# Patient Record
Sex: Female | Born: 1980
Health system: Southern US, Community
[De-identification: ages and names within clinical notes are randomized; demographics above are authoritative.]

## PROBLEM LIST (undated history)

## (undated) DIAGNOSIS — K219 Gastro-esophageal reflux disease without esophagitis: Secondary | ICD-10-CM

## (undated) DIAGNOSIS — I499 Cardiac arrhythmia, unspecified: Secondary | ICD-10-CM

## (undated) DIAGNOSIS — K449 Diaphragmatic hernia without obstruction or gangrene: Secondary | ICD-10-CM

## (undated) DIAGNOSIS — Z01419 Encounter for gynecological examination (general) (routine) without abnormal findings: Secondary | ICD-10-CM

## (undated) DIAGNOSIS — B999 Unspecified infectious disease: Secondary | ICD-10-CM

## (undated) HISTORY — PX: WISDOM TOOTH EXTRACTION: SHX21

## (undated) HISTORY — DX: Encounter for gynecological examination (general) (routine) without abnormal findings: Z01.419

---
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
## 1999-03-24 ENCOUNTER — Encounter (HOSPITAL_COMMUNITY): Admission: RE | Admit: 1999-03-24 | Discharge: 1999-03-30 | Payer: Self-pay | Admitting: *Deleted

## 1999-03-29 ENCOUNTER — Inpatient Hospital Stay (HOSPITAL_COMMUNITY): Admission: AD | Admit: 1999-03-29 | Discharge: 1999-03-31 | Payer: Self-pay | Admitting: *Deleted

## 1999-05-02 ENCOUNTER — Inpatient Hospital Stay: Admission: AD | Admit: 1999-05-02 | Discharge: 1999-05-02 | Payer: Self-pay | Admitting: *Deleted

## 1999-06-02 ENCOUNTER — Inpatient Hospital Stay (HOSPITAL_COMMUNITY): Admission: AD | Admit: 1999-06-02 | Discharge: 1999-06-02 | Payer: Self-pay | Admitting: *Deleted

## 1999-08-17 ENCOUNTER — Inpatient Hospital Stay (HOSPITAL_COMMUNITY): Admission: AD | Admit: 1999-08-17 | Discharge: 1999-08-17 | Payer: Self-pay | Admitting: *Deleted

## 1999-10-09 ENCOUNTER — Emergency Department (HOSPITAL_COMMUNITY): Admission: EM | Admit: 1999-10-09 | Discharge: 1999-10-09 | Payer: Self-pay | Admitting: Emergency Medicine

## 1999-11-23 ENCOUNTER — Inpatient Hospital Stay (HOSPITAL_COMMUNITY): Admission: AD | Admit: 1999-11-23 | Discharge: 1999-11-23 | Payer: Self-pay | Admitting: *Deleted

## 2000-02-14 ENCOUNTER — Inpatient Hospital Stay (HOSPITAL_COMMUNITY): Admission: AD | Admit: 2000-02-14 | Discharge: 2000-02-14 | Payer: Self-pay | Admitting: *Deleted

## 2000-05-08 ENCOUNTER — Inpatient Hospital Stay (HOSPITAL_COMMUNITY): Admission: AD | Admit: 2000-05-08 | Discharge: 2000-05-08 | Payer: Self-pay | Admitting: *Deleted

## 2000-05-11 ENCOUNTER — Emergency Department (HOSPITAL_COMMUNITY): Admission: EM | Admit: 2000-05-11 | Discharge: 2000-05-11 | Payer: Self-pay | Admitting: Emergency Medicine

## 2000-06-06 ENCOUNTER — Emergency Department (HOSPITAL_COMMUNITY): Admission: EM | Admit: 2000-06-06 | Discharge: 2000-06-07 | Payer: Self-pay

## 2000-07-31 ENCOUNTER — Inpatient Hospital Stay (HOSPITAL_COMMUNITY): Admission: AD | Admit: 2000-07-31 | Discharge: 2000-07-31 | Payer: Self-pay | Admitting: *Deleted

## 2000-10-23 ENCOUNTER — Inpatient Hospital Stay (HOSPITAL_COMMUNITY): Admission: AD | Admit: 2000-10-23 | Discharge: 2000-10-23 | Payer: Self-pay | Admitting: *Deleted

## 2001-10-14 ENCOUNTER — Encounter: Admission: RE | Admit: 2001-10-14 | Discharge: 2001-10-14 | Payer: Self-pay | Admitting: *Deleted

## 2002-03-01 ENCOUNTER — Inpatient Hospital Stay (HOSPITAL_COMMUNITY): Admission: AD | Admit: 2002-03-01 | Discharge: 2002-03-01 | Payer: Self-pay | Admitting: Obstetrics and Gynecology

## 2002-03-27 ENCOUNTER — Encounter: Payer: Self-pay | Admitting: Obstetrics

## 2002-03-27 ENCOUNTER — Encounter: Admission: RE | Admit: 2002-03-27 | Discharge: 2002-03-27 | Payer: Self-pay | Admitting: Obstetrics

## 2002-09-03 ENCOUNTER — Inpatient Hospital Stay (HOSPITAL_COMMUNITY): Admission: AD | Admit: 2002-09-03 | Discharge: 2002-09-03 | Payer: Self-pay | Admitting: Obstetrics

## 2002-09-05 ENCOUNTER — Inpatient Hospital Stay (HOSPITAL_COMMUNITY): Admission: AD | Admit: 2002-09-05 | Discharge: 2002-09-07 | Payer: Self-pay | Admitting: Obstetrics

## 2002-09-30 ENCOUNTER — Emergency Department (HOSPITAL_COMMUNITY): Admission: EM | Admit: 2002-09-30 | Discharge: 2002-09-30 | Payer: Self-pay | Admitting: Emergency Medicine

## 2003-08-15 ENCOUNTER — Emergency Department (HOSPITAL_COMMUNITY): Admission: EM | Admit: 2003-08-15 | Discharge: 2003-08-15 | Payer: Self-pay | Admitting: Emergency Medicine

## 2004-04-29 ENCOUNTER — Ambulatory Visit: Payer: Self-pay | Admitting: Internal Medicine

## 2004-05-09 ENCOUNTER — Ambulatory Visit: Payer: Self-pay | Admitting: Family Medicine

## 2004-08-03 ENCOUNTER — Ambulatory Visit: Payer: Self-pay | Admitting: Family Medicine

## 2004-10-18 ENCOUNTER — Ambulatory Visit: Payer: Self-pay | Admitting: Family Medicine

## 2005-01-12 ENCOUNTER — Ambulatory Visit: Payer: Self-pay | Admitting: Internal Medicine

## 2005-05-02 ENCOUNTER — Ambulatory Visit: Payer: Self-pay | Admitting: Family Medicine

## 2005-07-26 ENCOUNTER — Encounter: Payer: Self-pay | Admitting: Family Medicine

## 2005-07-26 ENCOUNTER — Ambulatory Visit: Payer: Self-pay | Admitting: Family Medicine

## 2005-07-26 ENCOUNTER — Other Ambulatory Visit: Admission: RE | Admit: 2005-07-26 | Discharge: 2005-07-26 | Payer: Self-pay | Admitting: Family Medicine

## 2005-10-12 ENCOUNTER — Ambulatory Visit: Payer: Self-pay | Admitting: Family Medicine

## 2006-01-02 ENCOUNTER — Ambulatory Visit: Payer: Self-pay | Admitting: Family Medicine

## 2006-03-28 ENCOUNTER — Ambulatory Visit: Payer: Self-pay | Admitting: Family Medicine

## 2006-06-21 ENCOUNTER — Ambulatory Visit: Payer: Self-pay | Admitting: Family Medicine

## 2006-09-13 ENCOUNTER — Ambulatory Visit: Payer: Self-pay | Admitting: Family Medicine

## 2006-12-16 ENCOUNTER — Ambulatory Visit: Payer: Self-pay | Admitting: Family Medicine

## 2007-03-10 ENCOUNTER — Ambulatory Visit: Payer: Self-pay | Admitting: Family Medicine

## 2007-10-10 ENCOUNTER — Ambulatory Visit: Payer: Self-pay | Admitting: Family Medicine

## 2007-10-10 LAB — CONVERTED CEMR LAB
ALT: 17 units/L (ref 0–35)
AST: 18 units/L (ref 0–37)
Albumin: 4.5 g/dL (ref 3.5–5.2)
Alkaline Phosphatase: 58 units/L (ref 39–117)
BUN: 12 mg/dL (ref 6–23)
Basophils Absolute: 0 10*3/uL (ref 0.0–0.1)
Basophils Relative: 0.2 % (ref 0.0–1.0)
Bilirubin, Direct: 0.1 mg/dL (ref 0.0–0.3)
CO2: 27 meq/L (ref 19–32)
Calcium: 9.9 mg/dL (ref 8.4–10.5)
Chloride: 109 meq/L (ref 96–112)
Cholesterol: 190 mg/dL (ref 0–200)
Creatinine, Ser: 0.7 mg/dL (ref 0.4–1.2)
Eosinophils Absolute: 0 10*3/uL (ref 0.0–0.7)
Eosinophils Relative: 0.7 % (ref 0.0–5.0)
GFR calc Af Amer: 128 mL/min
GFR calc non Af Amer: 106 mL/min
Glucose, Bld: 91 mg/dL (ref 70–99)
HCT: 40.8 % (ref 36.0–46.0)
HDL: 43.5 mg/dL (ref >39.0)
Hemoglobin: 13.8 g/dL (ref 12.0–15.0)
LDL Cholesterol: 135 mg/dL — ABNORMAL HIGH (ref 0–99)
Lymphocytes Relative: 38.1 % (ref 12.0–46.0)
MCHC: 33.9 g/dL (ref 30.0–36.0)
MCV: 91 fL (ref 78.0–100.0)
Monocytes Absolute: 0.3 10*3/uL (ref 0.1–1.0)
Monocytes Relative: 6.1 % (ref 3.0–12.0)
Neutro Abs: 3 10*3/uL (ref 1.4–7.7)
Neutrophils Relative %: 54.9 % (ref 43.0–77.0)
Platelets: 200 10*3/uL (ref 150–400)
Potassium: 4.1 meq/L (ref 3.5–5.1)
RBC: 4.48 M/uL (ref 3.87–5.11)
RDW: 12 % (ref 11.5–14.6)
Sodium: 141 meq/L (ref 135–145)
TSH: 0.95 microintl units/mL (ref 0.35–5.50)
Total Bilirubin: 0.8 mg/dL (ref 0.3–1.2)
Total CHOL/HDL Ratio: 4.4
Total Protein: 7.7 g/dL (ref 6.0–8.3)
Triglycerides: 57 mg/dL (ref 0–149)
VLDL: 11 mg/dL (ref 0–40)
WBC: 5.3 10*3/uL (ref 4.5–10.5)

## 2007-10-13 ENCOUNTER — Encounter: Payer: Self-pay | Admitting: Family Medicine

## 2007-10-16 LAB — CONVERTED CEMR LAB
Bilirubin Urine: NEGATIVE
Glucose, Urine, Semiquant: NEGATIVE
Ketones, urine, test strip: NEGATIVE
Nitrite: NEGATIVE
Protein, U semiquant: NEGATIVE
Specific Gravity, Urine: 1.02
Urobilinogen, UA: 1
pH: 6.5

## 2008-03-12 ENCOUNTER — Ambulatory Visit: Payer: Self-pay | Admitting: Family Medicine

## 2008-03-12 DIAGNOSIS — N62 Hypertrophy of breast: Secondary | ICD-10-CM | POA: Insufficient documentation

## 2008-03-12 LAB — CONVERTED CEMR LAB: Beta hcg, urine, semiquantitative: NEGATIVE

## 2008-07-12 ENCOUNTER — Ambulatory Visit: Payer: Self-pay | Admitting: Family Medicine

## 2008-07-12 DIAGNOSIS — N3000 Acute cystitis without hematuria: Secondary | ICD-10-CM

## 2008-07-12 LAB — CONVERTED CEMR LAB
Beta hcg, urine, semiquantitative: NEGATIVE
Bilirubin Urine: NEGATIVE
Ketones, urine, test strip: NEGATIVE
Specific Gravity, Urine: 1.025
pH: 7

## 2008-07-22 ENCOUNTER — Telehealth: Payer: Self-pay | Admitting: Family Medicine

## 2008-07-22 ENCOUNTER — Ambulatory Visit: Payer: Self-pay | Admitting: Internal Medicine

## 2008-07-22 DIAGNOSIS — R599 Enlarged lymph nodes, unspecified: Secondary | ICD-10-CM | POA: Insufficient documentation

## 2008-07-28 ENCOUNTER — Telehealth: Payer: Self-pay | Admitting: Family Medicine

## 2008-08-06 ENCOUNTER — Telehealth: Payer: Self-pay | Admitting: Family Medicine

## 2008-09-24 ENCOUNTER — Ambulatory Visit: Payer: Self-pay | Admitting: Family Medicine

## 2008-09-24 LAB — CONVERTED CEMR LAB
Glucose, Urine, Semiquant: NEGATIVE
Specific Gravity, Urine: 1.02
WBC Urine, dipstick: NEGATIVE
pH: 5.5

## 2008-09-27 ENCOUNTER — Encounter: Payer: Self-pay | Admitting: Family Medicine

## 2008-09-27 LAB — CONVERTED CEMR LAB
ALT: 18 units/L (ref 0–35)
BUN: 10 mg/dL (ref 6–23)
Basophils Relative: 0.3 % (ref 0.0–3.0)
CO2: 24 meq/L (ref 19–32)
Chloride: 109 meq/L (ref 96–112)
Direct LDL: 123.7 mg/dL
Eosinophils Relative: 1.1 % (ref 0.0–5.0)
Glucose, Bld: 68 mg/dL — ABNORMAL LOW (ref 70–99)
HCT: 38.4 % (ref 36.0–46.0)
Lymphs Abs: 1.6 10*3/uL (ref 0.7–4.0)
MCV: 90.5 fL (ref 78.0–100.0)
Monocytes Absolute: 0.3 10*3/uL (ref 0.1–1.0)
Platelets: 167 10*3/uL (ref 150.0–400.0)
Potassium: 3.6 meq/L (ref 3.5–5.1)
TSH: 1.23 microintl units/mL (ref 0.35–5.50)
Total Bilirubin: 0.6 mg/dL (ref 0.3–1.2)
Total Protein: 7.3 g/dL (ref 6.0–8.3)
VLDL: 18.6 mg/dL (ref 0.0–40.0)
WBC: 3.9 10*3/uL — ABNORMAL LOW (ref 4.5–10.5)

## 2008-10-19 ENCOUNTER — Ambulatory Visit: Payer: Self-pay | Admitting: Family Medicine

## 2008-10-19 ENCOUNTER — Other Ambulatory Visit: Admission: RE | Admit: 2008-10-19 | Discharge: 2008-10-19 | Payer: Self-pay | Admitting: Family Medicine

## 2008-10-19 ENCOUNTER — Encounter: Payer: Self-pay | Admitting: Family Medicine

## 2008-12-22 ENCOUNTER — Ambulatory Visit: Payer: Self-pay | Admitting: Family Medicine

## 2008-12-22 DIAGNOSIS — M26629 Arthralgia of temporomandibular joint, unspecified side: Secondary | ICD-10-CM

## 2008-12-22 LAB — CONVERTED CEMR LAB
Nitrite: NEGATIVE
Specific Gravity, Urine: 1.02

## 2008-12-23 ENCOUNTER — Encounter: Payer: Self-pay | Admitting: Family Medicine

## 2009-01-28 ENCOUNTER — Telehealth: Payer: Self-pay | Admitting: Family Medicine

## 2009-06-02 ENCOUNTER — Emergency Department (HOSPITAL_COMMUNITY): Admission: EM | Admit: 2009-06-02 | Discharge: 2009-06-02 | Payer: Self-pay | Admitting: Family Medicine

## 2009-09-23 ENCOUNTER — Ambulatory Visit: Payer: Self-pay | Admitting: Family Medicine

## 2009-09-23 DIAGNOSIS — K5289 Other specified noninfective gastroenteritis and colitis: Secondary | ICD-10-CM

## 2009-09-23 LAB — CONVERTED CEMR LAB
Nitrite: NEGATIVE
Specific Gravity, Urine: 1.015
Urobilinogen, UA: 0.2

## 2009-10-20 ENCOUNTER — Telehealth: Payer: Self-pay | Admitting: Family Medicine

## 2010-01-18 ENCOUNTER — Ambulatory Visit: Payer: Self-pay | Admitting: Family Medicine

## 2010-01-18 LAB — CONVERTED CEMR LAB
Protein, U semiquant: NEGATIVE
Urobilinogen, UA: 0.2
WBC Urine, dipstick: NEGATIVE

## 2010-01-19 LAB — CONVERTED CEMR LAB
ALT: 15 units/L (ref 0–35)
AST: 17 units/L (ref 0–37)
Alkaline Phosphatase: 39 units/L (ref 39–117)
BUN: 12 mg/dL (ref 6–23)
Basophils Absolute: 0 10*3/uL (ref 0.0–0.1)
Bilirubin, Direct: 0.1 mg/dL (ref 0.0–0.3)
Calcium: 9.5 mg/dL (ref 8.4–10.5)
Cholesterol: 184 mg/dL (ref 0–200)
Eosinophils Relative: 1.5 % (ref 0.0–5.0)
GFR calc non Af Amer: 137.31 mL/min (ref >60)
HCT: 38.5 % (ref 36.0–46.0)
HDL: 52 mg/dL (ref >39.00)
LDL Cholesterol: 118 mg/dL — ABNORMAL HIGH (ref 0–99)
Lymphocytes Relative: 57.7 % — ABNORMAL HIGH (ref 12.0–46.0)
Lymphs Abs: 2.4 10*3/uL (ref 0.7–4.0)
Monocytes Relative: 6.5 % (ref 3.0–12.0)
Platelets: 192 10*3/uL (ref 150.0–400.0)
Potassium: 3.6 meq/L (ref 3.5–5.1)
Sodium: 140 meq/L (ref 135–145)
Total Bilirubin: 0.6 mg/dL (ref 0.3–1.2)
VLDL: 13.8 mg/dL (ref 0.0–40.0)
WBC: 4.2 10*3/uL — ABNORMAL LOW (ref 4.5–10.5)

## 2010-02-01 ENCOUNTER — Ambulatory Visit: Payer: Self-pay | Admitting: Family Medicine

## 2010-02-01 DIAGNOSIS — K219 Gastro-esophageal reflux disease without esophagitis: Secondary | ICD-10-CM | POA: Insufficient documentation

## 2010-06-02 ENCOUNTER — Other Ambulatory Visit
Admission: RE | Admit: 2010-06-02 | Discharge: 2010-06-02 | Payer: Self-pay | Source: Home / Self Care | Admitting: Obstetrics and Gynecology

## 2010-06-07 ENCOUNTER — Ambulatory Visit (HOSPITAL_COMMUNITY)
Admission: RE | Admit: 2010-06-07 | Discharge: 2010-06-07 | Payer: Self-pay | Source: Home / Self Care | Attending: Obstetrics and Gynecology | Admitting: Obstetrics and Gynecology

## 2010-06-30 LAB — RPR: RPR: NONREACTIVE

## 2010-06-30 LAB — HEPATITIS B SURFACE ANTIGEN: Hepatitis B Surface Ag: NEGATIVE

## 2010-06-30 LAB — RUBELLA ANTIBODY, IGM: Rubella: IMMUNE

## 2010-06-30 LAB — ABO/RH: RH Type: POSITIVE

## 2010-07-02 ENCOUNTER — Encounter: Payer: Self-pay | Admitting: Obstetrics and Gynecology

## 2010-07-11 NOTE — Assessment & Plan Note (Signed)
Summary: CPX // RS   Vital Signs:  Patient profile:   30 year old female Weight:      148 pounds BP sitting:   104 / 80  (left arm) Cuff size:   regular  Vitals Entered By: Raechel Ache, RN (February 01, 2010 1:35 PM) CC: CPX, labs done.   History of Present Illness: 30 yr old female for a cpx. She feels fine and has no concerns.   Allergies (verified): No Known Drug Allergies  Past History:  Past Medical History: Umbilical Hernia sees Dr. Renette Butters for GYN exams GERD  Past Surgical History: Reviewed history from 03/05/2007 and no changes required. Unremarkable  Family History: Reviewed history from 03/05/2007 and no changes required. Family History of Arthritis Family History Diabetes 1st degree relative Family History Hypertension  Social History: Reviewed history from 03/05/2007 and no changes required. Occupation: Married Never Smoked Alcohol use-yes Drug use-no Regular exercise-no  Review of Systems  The patient denies anorexia, fever, weight loss, weight gain, vision loss, decreased hearing, hoarseness, chest pain, syncope, dyspnea on exertion, peripheral edema, prolonged cough, headaches, hemoptysis, abdominal pain, melena, hematochezia, severe indigestion/heartburn, hematuria, incontinence, genital sores, muscle weakness, suspicious skin lesions, transient blindness, difficulty walking, depression, unusual weight change, abnormal bleeding, enlarged lymph nodes, angioedema, breast masses, and testicular masses.    Physical Exam  General:  Well-developed,well-nourished,in no acute distress; alert,appropriate and cooperative throughout examination Head:  Normocephalic and atraumatic without obvious abnormalities. No apparent alopecia or balding. Eyes:  No corneal or conjunctival inflammation noted. EOMI. Perrla. Funduscopic exam benign, without hemorrhages, exudates or papilledema. Vision grossly normal. Ears:  External ear exam shows no significant lesions  or deformities.  Otoscopic examination reveals clear canals, tympanic membranes are intact bilaterally without bulging, retraction, inflammation or discharge. Hearing is grossly normal bilaterally. Nose:  External nasal examination shows no deformity or inflammation. Nasal mucosa are pink and moist without lesions or exudates. Mouth:  Oral mucosa and oropharynx without lesions or exudates.  Teeth in good repair. Neck:  No deformities, masses, or tenderness noted. Chest Wall:  No deformities, masses, or tenderness noted. Lungs:  Normal respiratory effort, chest expands symmetrically. Lungs are clear to auscultation, no crackles or wheezes. Heart:  Normal rate and regular rhythm. S1 and S2 normal without gallop, murmur, click, rub or other extra sounds. Abdomen:  Bowel sounds positive,abdomen soft and non-tender without masses, organomegaly or hernias noted. Msk:  No deformity or scoliosis noted of thoracic or lumbar spine.   Pulses:  R and L carotid,radial,femoral,dorsalis pedis and posterior tibial pulses are full and equal bilaterally Extremities:  No clubbing, cyanosis, edema, or deformity noted with normal full range of motion of all joints.   Neurologic:  No cranial nerve deficits noted. Station and gait are normal. Plantar reflexes are down-going bilaterally. DTRs are symmetrical throughout. Sensory, motor and coordinative functions appear intact. Skin:  Intact without suspicious lesions or rashes Cervical Nodes:  No lymphadenopathy noted Axillary Nodes:  No palpable lymphadenopathy Inguinal Nodes:  No significant adenopathy Psych:  Cognition and judgment appear intact. Alert and cooperative with normal attention span and concentration. No apparent delusions, illusions, hallucinations   Impression & Recommendations:  Problem # 1:  WELL ADULT EXAM (ICD-V70.0)  Complete Medication List: 1)  Prilosec Otc 20 Mg Tbec (Omeprazole magnesium) .... Once daily as needed  Patient  Instructions: 1)  Please schedule a follow-up appointment in 1 year.

## 2010-07-11 NOTE — Assessment & Plan Note (Signed)
Summary: nausea/diarrhea/cjr   Vital Signs:  Patient profile:   30 year old female Weight:      146 pounds BMI:     23.30 Temp:     98.8 degrees F oral BP sitting:   110 / 82  (left arm) Cuff size:   regular  Vitals Entered By: Raechel Ache, RN (September 23, 2009 2:35 PM) CC: C/o no appetite, nausea, weak and diarrhea x 4 days. Thought is was her BCP- last took Monday.   History of Present Illness: Here for 4 days of mild abdominal cramps, nausea without vomitting, and watery diarrhea. No fever. Appetite is down but she drinks plenty of fluids. No recent antibiotics, no recent travel. No one else in her family is sick. No respiratory or urinary symptoms. Her menses ended a couple days ago.   Allergies (verified): No Known Drug Allergies  Past History:  Past Medical History: Reviewed history from 03/05/2007 and no changes required. Umbilical Hernia  Past Surgical History: Reviewed history from 03/05/2007 and no changes required. Unremarkable  Review of Systems  The patient denies anorexia, fever, weight loss, weight gain, vision loss, decreased hearing, hoarseness, chest pain, syncope, dyspnea on exertion, peripheral edema, prolonged cough, headaches, hemoptysis, abdominal pain, melena, hematochezia, severe indigestion/heartburn, hematuria, incontinence, genital sores, muscle weakness, suspicious skin lesions, transient blindness, difficulty walking, depression, unusual weight change, abnormal bleeding, enlarged lymph nodes, angioedema, breast masses, and testicular masses.    Physical Exam  General:  Well-developed,well-nourished,in no acute distress; alert,appropriate and cooperative throughout examination Neck:  No deformities, masses, or tenderness noted. Lungs:  Normal respiratory effort, chest expands symmetrically. Lungs are clear to auscultation, no crackles or wheezes. Heart:  Normal rate and regular rhythm. S1 and S2 normal without gallop, murmur, click, rub or other  extra sounds. Abdomen:  Bowel sounds positive,abdomen soft and non-tender without masses, organomegaly or hernias noted.   Impression & Recommendations:  Problem # 1:  GASTROENTERITIS (ICD-558.9)  Complete Medication List: 1)  Loestrin 24 Fe 1-20 Mg-mcg Tabs (Norethin ace-eth estrad-fe) .Marland Kitchen.. 1 once daily 2)  Metronidazole 500 Mg Tabs (Metronidazole) .... Two times a day  Patient Instructions: 1)  Try Flagyl. Use Imodium as needed . 2)  Please schedule a follow-up appointment as needed .  Prescriptions: METRONIDAZOLE 500 MG TABS (METRONIDAZOLE) two times a day  #14 x 0   Entered and Authorized by:   Nelwyn Salisbury MD   Signed by:   Nelwyn Salisbury MD on 09/23/2009   Method used:   Electronically to        [facility] [address] [phone]* (retail)       [address]       [place], [place]  [address]       Ph: [phone] or [phone]       Fax: [phone]   RxID:   [id number]   Laboratory Results   Urine Tests    Routine Urinalysis   Color: gold Appearance: Clear Glucose: negative   (Normal Range: Negative) Bilirubin: negative   (Normal Range: Negative) Ketone: negative   (Normal Range: Negative) Spec. Gravity: 1.015   (Normal Range: 1.003-1.035) Blood: moderate   (Normal Range: Negative) pH: 5.0   (Normal Range: 5.0-8.0) Protein: 30   (Normal Range: Negative) Urobilinogen: 0.2   (Normal Range: 0-1) Nitrite: negative   (Normal Range: Negative) Leukocyte Esterace: negative   (Normal Range: Negative)

## 2010-07-11 NOTE — Progress Notes (Signed)
Summary: Pt finished antibiotics, but is still having same symptoms  Phone Note Call from Patient Call back at Home Phone [phone]   Caller: Patient Summary of Call: Pt called and said that the antibiotic seemed to have worked. Pt took all of the medicine and now is experiencing the same symptoms as before. Nausea,fatigue,loss of appetite,but no diarrhea this time. Pt is wondering what Dr. Clent Ridges would recommend her to do?   Initial call taken by: Lucy Antigua,  Oct 20, 2009 4:32 PM  Follow-up for Phone Call        in case this could related to a stomach acid problem, try Prilosec OTC daily for a week. See me after that  Follow-up by: Nelwyn Salisbury MD,  Oct 21, 2009 8:37 AM  Additional Follow-up for Phone Call Additional follow up Details #1::        Pt. notified. Additional Follow-up by: Lynann Beaver CMA,  Oct 21, 2009 10:16 AM

## 2010-09-11 LAB — POCT URINALYSIS DIP (DEVICE)
Ketones, ur: NEGATIVE mg/dL
Protein, ur: 100 mg/dL — AB
Specific Gravity, Urine: 1.02 (ref 1.005–1.030)
Urobilinogen, UA: 2 mg/dL — ABNORMAL HIGH (ref 0.0–1.0)
pH: 6.5 (ref 5.0–8.0)

## 2010-11-16 ENCOUNTER — Inpatient Hospital Stay (HOSPITAL_COMMUNITY)
Admission: AD | Admit: 2010-11-16 | Discharge: 2010-11-16 | Disposition: A | Discharge status: Home / Self Care | Payer: Self-pay | Source: Ambulatory Visit | Attending: Obstetrics and Gynecology | Admitting: Obstetrics and Gynecology

## 2010-11-16 DIAGNOSIS — R109 Unspecified abdominal pain: Secondary | ICD-10-CM | POA: Insufficient documentation

## 2010-11-16 DIAGNOSIS — O4700 False labor before 37 completed weeks of gestation, unspecified trimester: Secondary | ICD-10-CM | POA: Insufficient documentation

## 2010-11-16 DIAGNOSIS — N390 Urinary tract infection, site not specified: Secondary | ICD-10-CM | POA: Insufficient documentation

## 2010-11-16 DIAGNOSIS — O2390 Unspecified genitourinary tract infection in pregnancy, unspecified trimester: Secondary | ICD-10-CM | POA: Insufficient documentation

## 2010-11-16 LAB — URINE MICROSCOPIC-ADD ON

## 2010-11-16 LAB — URINALYSIS, ROUTINE W REFLEX MICROSCOPIC
Glucose, UA: NEGATIVE mg/dL
Protein, ur: NEGATIVE mg/dL
pH: 6.5 (ref 5.0–8.0)

## 2010-11-17 LAB — URINE CULTURE: Culture: NO GROWTH

## 2010-11-24 LAB — STREP B DNA PROBE: GBS: NEGATIVE

## 2010-12-22 ENCOUNTER — Other Ambulatory Visit: Payer: Self-pay | Admitting: Obstetrics and Gynecology

## 2010-12-22 DIAGNOSIS — O480 Post-term pregnancy: Secondary | ICD-10-CM | POA: Insufficient documentation

## 2010-12-23 ENCOUNTER — Inpatient Hospital Stay (HOSPITAL_COMMUNITY)
Admission: AD | Admit: 2010-12-23 | Discharge: 2010-12-25 | DRG: 767 | Disposition: A | Discharge status: Home / Self Care | Payer: 59 | Source: Ambulatory Visit | Attending: Obstetrics and Gynecology | Admitting: Obstetrics and Gynecology

## 2010-12-23 ENCOUNTER — Inpatient Hospital Stay (HOSPITAL_COMMUNITY): Admission: RE | Admit: 2010-12-23 | Payer: Self-pay | Source: Ambulatory Visit

## 2010-12-23 ENCOUNTER — Encounter (HOSPITAL_COMMUNITY): Payer: Self-pay | Admitting: *Deleted

## 2010-12-23 DIAGNOSIS — O480 Post-term pregnancy: Secondary | ICD-10-CM

## 2010-12-23 DIAGNOSIS — Z302 Encounter for sterilization: Secondary | ICD-10-CM

## 2010-12-23 HISTORY — DX: Gastro-esophageal reflux disease without esophagitis: K21.9

## 2010-12-23 HISTORY — DX: Unspecified infectious disease: B99.9

## 2010-12-23 LAB — CBC
MCHC: 34 g/dL (ref 30.0–36.0)
Platelets: 165 10*3/uL (ref 150–400)
RDW: 13.7 % (ref 11.5–15.5)

## 2010-12-23 LAB — RPR: RPR Ser Ql: NONREACTIVE

## 2010-12-23 MED ORDER — DIPHENHYDRAMINE HCL 50 MG/ML IJ SOLN: 12.5000 mg | INTRAMUSCULAR | Status: DC | PRN

## 2010-12-23 MED ORDER — NALBUPHINE SYRINGE 5 MG/0.5 ML
INJECTION | INTRAMUSCULAR | Status: AC
  Administered 2010-12-23: 5 mg via INTRAVENOUS
  Filled 2010-12-23: qty 0.5

## 2010-12-23 MED ORDER — EPHEDRINE 5 MG/ML INJ
10.0000 mg | INTRAVENOUS | Status: DC | PRN
  Filled 2010-12-23 (×2): qty 4

## 2010-12-23 MED ORDER — TERBUTALINE SULFATE 1 MG/ML IJ SOLN: 0.2500 mg | Freq: Once | INTRAMUSCULAR | Status: AC | PRN

## 2010-12-23 MED ORDER — CITRIC ACID-SODIUM CITRATE 334-500 MG/5ML PO SOLN: 30.0000 mL | ORAL | Status: DC | PRN

## 2010-12-23 MED ORDER — FENTANYL 2.5 MCG/ML BUPIVACAINE 1/10 % EPIDURAL INFUSION (WH - ANES)
14.0000 mL/h | INTRAMUSCULAR | Status: DC
  Administered 2010-12-23: 14 mL/h via EPIDURAL
  Filled 2010-12-23: qty 60

## 2010-12-23 MED ORDER — OXYTOCIN 20 UNITS IN LACTATED RINGERS INFUSION - SIMPLE
1.0000 m[IU]/min | INTRAVENOUS | Status: DC
Start: 2010-12-23 — End: 2010-12-24
  Administered 2010-12-23: 8 m[IU]/min via INTRAVENOUS
  Administered 2010-12-23: 2 m[IU]/min via INTRAVENOUS
  Administered 2010-12-23: 6 m[IU]/min via INTRAVENOUS
  Filled 2010-12-23: qty 1000

## 2010-12-23 MED ORDER — FLEET ENEMA 7-19 GM/118ML RE ENEM: 1.0000 | ENEMA | RECTAL | Status: DC | PRN

## 2010-12-23 MED ORDER — EPHEDRINE 5 MG/ML INJ
10.0000 mg | INTRAVENOUS | Status: DC | PRN
  Filled 2010-12-23: qty 4

## 2010-12-23 MED ORDER — ACETAMINOPHEN 325 MG PO TABS: 650.0000 mg | ORAL_TABLET | ORAL | Status: DC | PRN

## 2010-12-23 MED ORDER — LIDOCAINE HCL (PF) 1 % IJ SOLN
30.0000 mL | INTRAMUSCULAR | Status: DC | PRN
  Filled 2010-12-23: qty 30

## 2010-12-23 MED ORDER — OXYCODONE-ACETAMINOPHEN 5-325 MG PO TABS: 2.0000 | ORAL_TABLET | ORAL | Status: DC | PRN

## 2010-12-23 MED ORDER — LACTATED RINGERS IV SOLN
500.0000 mL | Freq: Once | INTRAVENOUS | Status: AC
  Administered 2010-12-23: 1000 mL via INTRAVENOUS

## 2010-12-23 MED ORDER — PHENYLEPHRINE 40 MCG/ML (10ML) SYRINGE FOR IV PUSH (FOR BLOOD PRESSURE SUPPORT)
80.0000 ug | PREFILLED_SYRINGE | INTRAVENOUS | Status: DC | PRN
  Filled 2010-12-23: qty 5

## 2010-12-23 MED ORDER — IBUPROFEN 600 MG PO TABS: 600.0000 mg | ORAL_TABLET | Freq: Four times a day (QID) | ORAL | Status: DC | PRN

## 2010-12-23 MED ORDER — PHENYLEPHRINE 40 MCG/ML (10ML) SYRINGE FOR IV PUSH (FOR BLOOD PRESSURE SUPPORT)
80.0000 ug | PREFILLED_SYRINGE | INTRAVENOUS | Status: DC | PRN
  Filled 2010-12-23 (×2): qty 5

## 2010-12-23 MED ORDER — ONDANSETRON HCL 4 MG/2ML IJ SOLN: 4.0000 mg | Freq: Four times a day (QID) | INTRAMUSCULAR | Status: DC | PRN

## 2010-12-23 MED ORDER — LACTATED RINGERS IV SOLN: INTRAVENOUS | Status: DC

## 2010-12-23 MED ORDER — LACTATED RINGERS IV SOLN: 500.0000 mL | INTRAVENOUS | Status: DC | PRN

## 2010-12-23 MED ORDER — MISOPROSTOL 25 MCG QUARTER TABLET
25.0000 ug | ORAL_TABLET | ORAL | Status: DC | PRN
  Administered 2010-12-23: 25 ug via VAGINAL
  Filled 2010-12-23: qty 1
  Filled 2010-12-23: qty 0.25

## 2010-12-23 MED ORDER — NALBUPHINE HCL 10 MG/ML IJ SOLN
5.0000 mg | INTRAMUSCULAR | Status: DC | PRN
  Filled 2010-12-23: qty 0.5

## 2010-12-23 MED ORDER — LIDOCAINE HCL (PF) 2 % IJ SOLN
INTRAMUSCULAR | Status: DC | PRN
  Administered 2010-12-23: 50 mg
  Administered 2010-12-23: 40 mg

## 2010-12-23 NOTE — H&P (Signed)
Rachel Gallagher is a 30 y.o. female presenting for induction of labor secondary to post dates. Pt is 40 wks and 3 days based on 12 wk u/s with EDD 12/20/2010. Pregnancy has been uncomplicated. @IPILAPH @ OB History    Grav Para Term Preterm Abortions TAB SAB Ect Mult Living   3 2 2  0 0 0 0 0 0 2     P GYn hx.. Hx of chlamydia P Ob hx SVD x 2..  Past Medical History  Diagnosis Date  . Infection     pt has uti  . GERD (gastroesophageal reflux disease)   . Blood dyscrasia    Past Surgical History  Procedure Date  . No past surgeries    Family History: family history is not on file. Social History:  reports that she has never smoked. She does not have any smokeless tobacco history on file. She reports that she does not drink alcohol or use illicit drugs.  @ROS  Negative except as stated in HPI@  Dilation: Fingertip Effacement (%): 50 Station: -2 Exam by:: k fields, rn Blood pressure 136/80, pulse 77, temperature 99 F (37.2 C), temperature source Oral, resp. rate 18, height 5\' 6"  (1.676 m), weight 80.74 kg (178 lb). @IPILAEXAM @  Physical Exam : AF VSS CV/ RRR Lungs Clear Abd; Gravid nt  Ex no clubbing cyanosis or edema...  Cx currently 2/75/-2 station  After 1 dose of cytotec .. (Above cervical was by nurse on admission)  @PHYSEXAMBYAGE2 @  Prenatal labs: ABO, Rh:   Antibody: Negative (01/20 0000) Rubella:   RPR: Nonreactive (01/20 0000)  HBsAg: Negative (01/20 0000)  HIV: Non-reactive (01/20 0000)  GBS: Negative (06/15 0930)   Assessment/Plan:  40 wks 3 days post dates for induction.. .. Start pitocin ... Pt also desires a post partum tubal ligation. /// r/b/a of btl were discussed with the patient including but not limited to infection/ bleeding/ damage to bowel bladder and surrounding organs with the need for further surgery. R/o 1% failure discussed with patient with 50 % r/o ectopic pregnancy occurs was discussed.   Rachel Gallagher J. 12/23/2010, 2:44 PM

## 2010-12-23 NOTE — Progress Notes (Signed)
In to assess patient... Pitocin at 6  Mu. ..... Pain controlled. AF VSS Cx 3-4 / 75/-2 FHR : Baseline 120 good btbv + accels no decels Toco: Ctx q 2 miin  A/p 40  3/7 wks Arom minimal fluid /IUPC placed Anticipate svd

## 2010-12-23 NOTE — Progress Notes (Signed)
Operative Delivery Note At 11:00 PM a viable female was delivered via Vaginal, Vacuum Investment banker, operational).  Presentation: vertex; Position: Occiput,, Posterior; Station: +3.  Verbal consent: obtained from patient.  Risks and benefits discussed in detail.  Risks include, but are not limited to the risks of anesthesia, bleeding, infection, damage to maternal tissues, fetal cephalhematoma.  There is also the risk of inability to effect vaginal delivery of the head, or shoulder dystocia that cannot be resolved by established maneuvers, leading to the need for emergency cesarean section.  APGAR: 8/9, ; weight .8lbs 13oz     Placenta status: ,complete .   3 vc Cord:  with the following complications: .  Cord pH: 7.15 Anesthesia:   Instrument: Vacuum used no pop offs.. Delivery after 2 contractions  Episiotomy: NONE   Lacerations: None  Suture Repair: not applicable  Est. Blood Loss (mL):500 cc .Marland Kitchen Uterine atony controlled with uterine massage/ pitocin and methergine 0.2mg  IM  Mom to postpartum.  Baby to nursery-stable.  Marilyne Haseley J. 12/23/2010, 11:24 PM

## 2010-12-23 NOTE — Anesthesia Preprocedure Evaluation (Addendum)
Anesthesia Evaluation  Name, MR# and DOB Patient awake  General Assessment Comment  Reviewed: Allergy & Precautions, H&P  and Patient's Chart, lab work & pertinent test results  Airway Mallampati: II TM Distance: >3 FB Neck ROM: full    Dental  (+) Teeth Intact   Pulmonary  clear to auscultation    Cardiovascular regular Normal   Neuro/Psych  GI/Hepatic/Renal (+)  GERD (no meds, rare sx)      Endo/Other   Abdominal   Musculoskeletal  Hematology   Peds  Reproductive/Obstetrics (+) Pregnancy   Anesthesia Other Findings                 Anesthesia Physical Anesthesia Plan  ASA: II  Anesthesia Plan: Epidural   Post-op Pain Management:    Induction:   Airway Management Planned:   Additional Equipment:   Intra-op Plan:   Post-operative Plan:   Informed Consent: I have reviewed the patients History and Physical, chart, labs and discussed the procedure including the risks, benefits and alternatives for the proposed anesthesia with the patient or authorized representative who has indicated his/her understanding and acceptance.   Dental Advisory Given  Plan Discussed with: CRNA and Surgeon  Anesthesia Plan Comments: (Labs checked- platelets confirmed with RN in room. Fetal heart tracing, per RN, reportedly stable enough for sitting procedure. Discussed epidural, and patient consents to the procedure:  included risk of possible headache,backache, failed block, allergic reaction, and nerve injury. This patient was asked if she had any questions or concerns before the procedure started. )        Anesthesia Quick Evaluation

## 2010-12-23 NOTE — Progress Notes (Signed)
Pt feeling pressure Cervix 9/ 100/0 FHr Baseline 110 Variable decels fair btbv.. Toco ctx q 2 min  Anticipate svd

## 2010-12-23 NOTE — Anesthesia Procedure Notes (Addendum)
Epidural Patient location during procedure: OB Start time: 12/23/2010 7:44 PM  Staffing Anesthesiologist: Jiles Garter  Preanesthetic Checklist Completed: patient identified, site marked, surgical consent, pre-op evaluation, timeout performed, IV checked, risks and benefits discussed and monitors and equipment checked  Epidural Patient position: sitting Prep: DuraPrep Patient monitoring: continuous pulse ox and blood pressure Approach: midline Injection technique: LOR air  Needle Needle type: Tuohy  Needle gauge: 17 G Needle length: 9 cm Catheter type: closed end flexible Catheter size: 19 Gauge Test dose: negative  Assessment Events: blood not aspirated, injection not painful, no injection resistance, negative IV test and no paresthesia Patient is more comfortable after epidural dosed. Please see RN's note for documentation of vital signs,and FHR which are stable.

## 2010-12-24 ENCOUNTER — Encounter (HOSPITAL_COMMUNITY): Payer: Self-pay | Admitting: Anesthesiology

## 2010-12-24 ENCOUNTER — Encounter (HOSPITAL_COMMUNITY)
Admission: AD | Disposition: A | Discharge status: Home / Self Care | Payer: Self-pay | Source: Ambulatory Visit | Attending: Obstetrics and Gynecology

## 2010-12-24 ENCOUNTER — Inpatient Hospital Stay (HOSPITAL_COMMUNITY): Payer: 59 | Admitting: Anesthesiology

## 2010-12-24 HISTORY — PX: TUBAL LIGATION: SHX77

## 2010-12-24 LAB — CBC
MCHC: 33.7 g/dL (ref 30.0–36.0)
Platelets: 152 10*3/uL (ref 150–400)
RDW: 13.7 % (ref 11.5–15.5)
WBC: 13.7 10*3/uL — ABNORMAL HIGH (ref 4.0–10.5)

## 2010-12-24 SURGERY — LIGATION, FALLOPIAN TUBE, POSTPARTUM
Anesthesia: Epidural | Laterality: Bilateral

## 2010-12-24 MED ORDER — IBUPROFEN 600 MG PO TABS
600.0000 mg | ORAL_TABLET | Freq: Four times a day (QID) | ORAL | Status: DC
  Administered 2010-12-24 – 2010-12-25 (×5): 600 mg via ORAL
  Filled 2010-12-24 (×5): qty 1

## 2010-12-24 MED ORDER — OXYCODONE-ACETAMINOPHEN 5-325 MG PO TABS
1.0000 | ORAL_TABLET | ORAL | Status: DC | PRN
  Administered 2010-12-24: 2 via ORAL
  Filled 2010-12-24: qty 2

## 2010-12-24 MED ORDER — BUPIVACAINE-EPINEPHRINE 0.25% -1:200000 IJ SOLN
INTRAMUSCULAR | Status: DC | PRN
  Administered 2010-12-24: 10 mL

## 2010-12-24 MED ORDER — PROPOFOL 10 MG/ML IV EMUL
INTRAVENOUS | Status: AC
  Filled 2010-12-24: qty 20

## 2010-12-24 MED ORDER — PROPOFOL 10 MG/ML IV EMUL
INTRAVENOUS | Status: DC | PRN
  Administered 2010-12-24: 10 mg via INTRAVENOUS

## 2010-12-24 MED ORDER — FENTANYL CITRATE 0.05 MG/ML IJ SOLN
INTRAMUSCULAR | Status: DC | PRN
  Administered 2010-12-24 (×2): 50 ug via INTRAVENOUS

## 2010-12-24 MED ORDER — FENTANYL CITRATE 0.05 MG/ML IJ SOLN
INTRAMUSCULAR | Status: AC
  Filled 2010-12-24: qty 2

## 2010-12-24 MED ORDER — CEFAZOLIN SODIUM 1-5 GM-% IV SOLN
INTRAVENOUS | Status: AC
  Filled 2010-12-24: qty 50

## 2010-12-24 MED ORDER — MIDAZOLAM HCL 2 MG/2ML IJ SOLN
INTRAMUSCULAR | Status: AC
  Filled 2010-12-24: qty 2

## 2010-12-24 MED ORDER — ONDANSETRON HCL 4 MG/2ML IJ SOLN
INTRAMUSCULAR | Status: DC | PRN
  Administered 2010-12-24: 4 mg via INTRAVENOUS

## 2010-12-24 MED ORDER — PRENATAL PLUS 27-1 MG PO TABS
1.0000 | ORAL_TABLET | Freq: Every day | ORAL | Status: DC
  Filled 2010-12-24 (×2): qty 1

## 2010-12-24 MED ORDER — CHLOROPROCAINE HCL 3 % IJ SOLN
INTRAMUSCULAR | Status: DC | PRN
  Administered 2010-12-24: 3 mL via EPIDURAL

## 2010-12-24 MED ORDER — METOCLOPRAMIDE HCL 10 MG PO TABS
10.0000 mg | ORAL_TABLET | Freq: Once | ORAL | Status: AC
  Administered 2010-12-24: 10 mg via ORAL
  Filled 2010-12-24: qty 1

## 2010-12-24 MED ORDER — LANOLIN HYDROUS EX OINT: TOPICAL_OINTMENT | CUTANEOUS | Status: DC | PRN

## 2010-12-24 MED ORDER — METHYLERGONOVINE MALEATE 0.2 MG/ML IJ SOLN
0.2000 mg | Freq: Once | INTRAMUSCULAR | Status: AC
  Administered 2010-12-23: 0.2 mg via INTRAMUSCULAR

## 2010-12-24 MED ORDER — MEPERIDINE HCL 25 MG/ML IJ SOLN: 6.2500 mg | INTRAMUSCULAR | Status: DC | PRN

## 2010-12-24 MED ORDER — DIPHENHYDRAMINE HCL 25 MG PO CAPS: 25.0000 mg | ORAL_CAPSULE | Freq: Four times a day (QID) | ORAL | Status: DC | PRN

## 2010-12-24 MED ORDER — CHLOROPROCAINE HCL 3 % IJ SOLN
INTRAMUSCULAR | Status: AC
  Filled 2010-12-24: qty 20

## 2010-12-24 MED ORDER — ONDANSETRON HCL 4 MG/2ML IJ SOLN: 4.0000 mg | INTRAMUSCULAR | Status: DC | PRN

## 2010-12-24 MED ORDER — ONDANSETRON HCL 4 MG/2ML IJ SOLN
INTRAMUSCULAR | Status: AC
  Filled 2010-12-24: qty 2

## 2010-12-24 MED ORDER — SENNOSIDES-DOCUSATE SODIUM 8.6-50 MG PO TABS
1.0000 | ORAL_TABLET | Freq: Every day | ORAL | Status: DC
  Administered 2010-12-24: 1 via ORAL

## 2010-12-24 MED ORDER — SIMETHICONE 80 MG PO CHEW: 80.0000 mg | CHEWABLE_TABLET | ORAL | Status: DC | PRN

## 2010-12-24 MED ORDER — TETANUS-DIPHTH-ACELL PERTUSSIS 5-2.5-18.5 LF-MCG/0.5 IM SUSP
0.5000 mL | Freq: Once | INTRAMUSCULAR | Status: DC
  Filled 2010-12-24: qty 0.5

## 2010-12-24 MED ORDER — ONDANSETRON HCL 4 MG PO TABS: 4.0000 mg | ORAL_TABLET | ORAL | Status: DC | PRN

## 2010-12-24 MED ORDER — LIDOCAINE-EPINEPHRINE (PF) 2 %-1:200000 IJ SOLN
INTRAMUSCULAR | Status: AC
  Filled 2010-12-24: qty 20

## 2010-12-24 MED ORDER — LIDOCAINE HCL (CARDIAC) 20 MG/ML IV SOLN
INTRAVENOUS | Status: AC
  Filled 2010-12-24: qty 5

## 2010-12-24 MED ORDER — LACTATED RINGERS IV SOLN
INTRAVENOUS | Status: DC
  Administered 2010-12-24: 06:00:00 via INTRAVENOUS

## 2010-12-24 MED ORDER — WITCH HAZEL-GLYCERIN EX PADS: MEDICATED_PAD | CUTANEOUS | Status: DC | PRN

## 2010-12-24 MED ORDER — FAMOTIDINE 20 MG PO TABS
40.0000 mg | ORAL_TABLET | Freq: Once | ORAL | Status: AC
  Administered 2010-12-24: 40 mg via ORAL
  Filled 2010-12-24: qty 2

## 2010-12-24 MED ORDER — MIDAZOLAM HCL 5 MG/5ML IJ SOLN
INTRAMUSCULAR | Status: DC | PRN
  Administered 2010-12-24: 2 mg via INTRAVENOUS

## 2010-12-24 MED ORDER — SODIUM BICARBONATE 8.4 % IV SOLN
INTRAVENOUS | Status: AC
  Filled 2010-12-24: qty 50

## 2010-12-24 MED ORDER — LIDOCAINE-EPINEPHRINE (PF) 2 %-1:200000 IJ SOLN
INTRAMUSCULAR | Status: DC | PRN
  Administered 2010-12-24: 3 mL via EPIDURAL

## 2010-12-24 MED ORDER — BENZOCAINE-MENTHOL 20-0.5 % EX AERO: 1.0000 "application " | INHALATION_SPRAY | CUTANEOUS | Status: DC | PRN

## 2010-12-24 MED ORDER — ZOLPIDEM TARTRATE 5 MG PO TABS: 5.0000 mg | ORAL_TABLET | Freq: Every evening | ORAL | Status: DC | PRN

## 2010-12-24 MED ORDER — METOCLOPRAMIDE HCL 5 MG/ML IJ SOLN: 10.0000 mg | Freq: Once | INTRAMUSCULAR | Status: AC | PRN

## 2010-12-24 MED ORDER — CEFAZOLIN SODIUM 1-5 GM-% IV SOLN
INTRAVENOUS | Status: DC | PRN
  Administered 2010-12-24: 1 g via INTRAVENOUS

## 2010-12-24 MED ORDER — CEFAZOLIN SODIUM 1-5 GM-% IV SOLN: 1.0000 g | INTRAVENOUS | Status: AC

## 2010-12-24 SURGICAL SUPPLY — 26 items
CLIP FILSHIE TUBAL LIGA STRL (Clip) ×1 IMPLANT
CLOTH BEACON ORANGE TIMEOUT ST (SAFETY) ×2 IMPLANT
CONTAINER PREFILL 10% NBF 15ML (MISCELLANEOUS) ×4 IMPLANT
DRAPE UTILITY XL STRL (DRAPES) ×1 IMPLANT
ELECT REM PT RETURN 9FT ADLT (ELECTROSURGICAL) ×2
ELECTRODE REM PT RTRN 9FT ADLT (ELECTROSURGICAL) IMPLANT
FILSHIE CLIP ×1 IMPLANT
GLOVE BIOGEL M 6.5 STRL (GLOVE) ×4 IMPLANT
GLOVE ECLIPSE 6.5 STRL STRAW (GLOVE) ×2 IMPLANT
GLOVE INDICATOR 7.0 STRL GRN (GLOVE) ×2 IMPLANT
GLOVE SKINSENSE NS SZ6.5 (GLOVE) ×2
GLOVE SKINSENSE STRL SZ6.5 (GLOVE) IMPLANT
GOWN BRE IMP SLV AUR LG STRL (GOWN DISPOSABLE) ×4 IMPLANT
NDL HYPO 25X1 1.5 SAFETY (NEEDLE) IMPLANT
NEEDLE HYPO 25X1 1.5 SAFETY (NEEDLE) ×2 IMPLANT
NS IRRIG 1000ML POUR BTL (IV SOLUTION) ×2 IMPLANT
PACK ABDOMINAL MINOR (CUSTOM PROCEDURE TRAY) ×2 IMPLANT
PENCIL BUTTON HOLSTER BLD 10FT (ELECTRODE) ×1 IMPLANT
SLEEVE SCD COMPRESS KNEE MED (MISCELLANEOUS) ×1 IMPLANT
SPONGE LAP 4X18 X RAY DECT (DISPOSABLE) ×1 IMPLANT
SUT VICRYL 0 UR6 27IN ABS (SUTURE) ×2 IMPLANT
SUT VICRYL 4-0 PS2 18IN ABS (SUTURE) ×2 IMPLANT
SYR CONTROL 10ML LL (SYRINGE) ×1 IMPLANT
TOWEL OR 17X24 6PK STRL BLUE (TOWEL DISPOSABLE) ×3 IMPLANT
TRAY FOLEY CATH 14FR (SET/KITS/TRAYS/PACK) ×2 IMPLANT
WATER STERILE IRR 1000ML POUR (IV SOLUTION) ×1 IMPLANT

## 2010-12-24 NOTE — Anesthesia Postprocedure Evaluation (Signed)
  Anesthesia Post-op Note  Patient: Rachel Gallagher  Procedure(s) Performed:  POST PARTUM TUBAL LIGATION  Patient Location: PACU  Anesthesia Type: Epidural  Level of Consciousness: awake, alert  and oriented  Airway and Oxygen Therapy: Patient Spontanous Breathing  Post-op Pain: none  Post-op Assessment: Post-op Vital signs reviewed, Patient's Cardiovascular Status Stable, Respiratory Function Stable, Patent Airway, No signs of Nausea or vomiting, Pain level controlled, No headache, No backache and No residual motor weakness  Post-op Vital Signs: Reviewed and stable  Complications: No apparent anesthesia complications

## 2010-12-24 NOTE — Op Note (Signed)
Operative note  date of surger 12/24/2010 Procedure postpartum bilateral tubal ligation with Filshie clips Preoperative diagnoses desires permanent sterilization #2 postpartum day #1 status post operative vaginal delivery with vacuum extractor Postoperative diagnosis is the same Surgeon Dr. Gerald Leitz Asst. none Anesthesia epidural Estimated blood loss less than 2 cc Complications none  Procedure patient was taken to the operating room where her epidural was redosed it was found to be adequate and a Foley catheter was placed she was prepped in the usual sterile fashion and draped with a sterile drape. A 3 cm infraumbilical incision was made with the scalpel. It was carried down to the underlying layer of fascia. The fascia was incised. The incision was extended laterally with Mayo scissors. The peritoneum was entered sharply with Metzenbaum scissors. The left fallopian tube was followed to the fimbriated end and Filshie clips were applied. This was repeated on the right fallopian tube. Per percent Marcaine was placed around the Filshie clips bilaterally. The fascia was reapproximated with 0 Vicryl in a running fashion. The skin was closed with 4-0 Vicryl in a running fashion. Dermabond was placed over the incision.  Sponge/needle counts were correct x2. The patient was taken to the recovery room awake and in stable condition.

## 2010-12-24 NOTE — Progress Notes (Addendum)
Post Partum Day 1 s/p vacuum assisted vaginal delivery Subjective: no complaints and up ad lib  Objective: Blood pressure 124/78, pulse 83, temperature 98.4 F (36.9 C), temperature source Oral, resp. rate 18, height 5\' 6"  (1.676 m), weight 80.74 kg (178 lb), SpO2 80.00%, unknown if currently breastfeeding.  Physical Exam:  General: alert Lochia: appropriate Uterine Fundus: firm Incision: not applicable  Ext:xam; dvt:[id number]}   Basename 12/24/10 0519 12/23/10 0820  HGB 11.7* 12.2  HCT 34.7* 35.9*    Assessment/Plan: Pt doing well s/p ovd.. Desire ppbtl and circumcision of infant    LOS: 1 day   Xian Apostol J. 12/24/2010, 7:29 AM    No evidence of dvt on exam.. Lower extremities without edema

## 2010-12-24 NOTE — Transfer of Care (Signed)
Immediate Anesthesia Transfer of Care Note  Patient: Rachel Gallagher  Procedure(s) Performed:  POST PARTUM TUBAL LIGATION  Patient Location: PACU  Anesthesia Type: Epidural  Level of Consciousness: awake and alert   Airway & Oxygen Therapy: Patient Spontanous Breathing  Post-op Assessment: Report given to PACU RN and Post -op Vital signs reviewed and stable  Post vital signs: Reviewed and stable  Complications: No apparent anesthesia complications

## 2010-12-25 MED ORDER — IBUPROFEN 600 MG PO TABS: 600.0000 mg | ORAL_TABLET | Freq: Four times a day (QID) | ORAL | Status: AC | PRN

## 2010-12-25 MED ORDER — OXYCODONE-ACETAMINOPHEN 5-325 MG PO TABS: 1.0000 | ORAL_TABLET | ORAL | Status: AC | PRN

## 2010-12-25 NOTE — Progress Notes (Signed)
Post Partum Day 2 Subjective: no complaints, up ad lib, voiding, tolerating PO and + flatus  Objective: Blood pressure 110/72, pulse 75, temperature 97.5 F (36.4 C), temperature source Oral, resp. rate 22, height 5\' 6"  (1.676 m), weight 80.74 kg (178 lb), SpO2 99.00%, unknown if currently breastfeeding.  Physical Exam:  General: alert Lochia: appropriate Uterine Fundus: firm Incision: healing well, no significant drainage, no dehiscence DVT Evaluation: No evidence of DVT seen on physical exam.   Basename 12/24/10 0519 12/23/10 0820  HGB 11.7* 12.2  HCT 34.7* 35.9*    Assessment/Plan: Discharge home, Circumcision prior to discharge and Contraception btl   LOS: 2 days   Rachel Gallagher J. 12/25/2010, 9:02 AM

## 2010-12-25 NOTE — Discharge Summary (Signed)
Obstetric Discharge Summary Reason for Admission: induction of labor Prenatal Procedures: none Intrapartum Procedures: vacuum extraction  Postpartum Procedures: P.P. tubal ligation Complications-Operative and Postpartum: none  Hemoglobin  Date Value Range Status  12/24/2010 11.7* 12.0-15.0 (g/dL) Final     HCT  Date Value Range Status  12/24/2010 34.7* 36.0-46.0 (%) Final    Discharge Diagnoses: Term Pregnancy-delivered and s/p postpartum tubal ligation   Discharge Information: Date: 12/25/2010 Activity: pelvic rest Diet: routine Medications: PNV, Ibuprophen and Percocet Condition: stable Instructions: refer to practice specific booklet Discharge to: home   Newborn Data: Live born  Information for the patient's newborn:  Ellerie, Arenz [[id number]]  female ; APGAR , ; weight ;  Home with mother.  Nicolena Schurman J. 12/25/2010, 8:59 AM

## 2011-01-09 ENCOUNTER — Encounter (HOSPITAL_COMMUNITY): Payer: Self-pay | Admitting: Obstetrics and Gynecology

## 2011-02-07 ENCOUNTER — Telehealth: Payer: Self-pay | Admitting: Family Medicine

## 2011-02-07 NOTE — Telephone Encounter (Signed)
Pt would like to be referred to a surgeon to have hernia surgery. No major problems. She stated that she is tired of her belly button sticking out. Please advise. Thanks.

## 2011-02-07 NOTE — Telephone Encounter (Signed)
She needs to see me first. She has not been here in over a year

## 2011-02-08 NOTE — Telephone Encounter (Signed)
Attempted to leave message. No voicemail. I will keep trying to reach her.

## 2011-02-15 NOTE — Telephone Encounter (Signed)
Tried again to reach the patient. No voicemail.

## 2011-02-22 ENCOUNTER — Encounter: Payer: Self-pay | Admitting: Family Medicine

## 2011-02-22 ENCOUNTER — Ambulatory Visit (INDEPENDENT_AMBULATORY_CARE_PROVIDER_SITE_OTHER): Payer: 59 | Admitting: Family Medicine

## 2011-02-22 VITALS — BP 114/64 | HR 88 | Temp 98.8°F | Wt 149.0 lb

## 2011-02-22 DIAGNOSIS — R42 Dizziness and giddiness: Secondary | ICD-10-CM

## 2011-02-22 LAB — CBC WITH DIFFERENTIAL/PLATELET
Basophils Absolute: 0 10*3/uL (ref 0.0–0.1)
Eosinophils Absolute: 0 10*3/uL (ref 0.0–0.7)
Eosinophils Relative: 0.8 % (ref 0.0–5.0)
HCT: 39.7 % (ref 36.0–46.0)
Lymphs Abs: 2.1 10*3/uL (ref 0.7–4.0)
MCHC: 33.3 g/dL (ref 30.0–36.0)
MCV: 90.6 fl (ref 78.0–100.0)
Monocytes Absolute: 0.3 10*3/uL (ref 0.1–1.0)
Platelets: 197 10*3/uL (ref 150.0–400.0)
RDW: 13.7 % (ref 11.5–14.6)

## 2011-02-22 LAB — BASIC METABOLIC PANEL
BUN: 13 mg/dL (ref 6–23)
Calcium: 9.4 mg/dL (ref 8.4–10.5)
Creatinine, Ser: 0.7 mg/dL (ref 0.4–1.2)

## 2011-02-22 LAB — TSH: TSH: 0.75 u[IU]/mL (ref 0.35–5.50)

## 2011-02-22 NOTE — Progress Notes (Signed)
  Subjective:    Patient ID: Rachel Gallagher, female    DOB: 1980-11-18, 30 y.o.   MRN: [id number]  HPI Here for intermittent dizziness over the past 3 weeks. This is very mild, and she describes it as "feeling off" rather than the room spinning. She has had some allergy symptoms lately such as stuffy head and nose, and some PND. No fever or HA. She has a hx of anemia.    Review of Systems  Constitutional: Negative.   HENT: Positive for congestion and postnasal drip.   Eyes: Negative.   Respiratory: Negative.   Cardiovascular: Negative.   Neurological: Positive for dizziness.       Objective:   Physical Exam  Constitutional: She is oriented to person, place, and time. She appears well-developed and well-nourished.  HENT:  Right Ear: External ear normal.  Left Ear: External ear normal.  Nose: Nose normal.  Mouth/Throat: Oropharynx is clear and moist. No oropharyngeal exudate.  Eyes: Conjunctivae and EOM are normal. Pupils are equal, round, and reactive to light.  Neck: No thyromegaly present.  Cardiovascular: Normal rate, regular rhythm, normal heart sounds and intact distal pulses.   Pulmonary/Chest: Effort normal and breath sounds normal.  Lymphadenopathy:    She has no cervical adenopathy.  Neurological: She is alert and oriented to person, place, and time. No cranial nerve deficit. She exhibits normal muscle tone. Coordination normal.          Assessment & Plan:  This is probably due to some sinus congestion, so I suggested she try Claritin or Zyrtec daily. Get labs

## 2011-02-28 ENCOUNTER — Telehealth: Payer: Self-pay | Admitting: Family Medicine

## 2011-02-28 NOTE — Telephone Encounter (Signed)
Requesting lab results from last week. Thanks. °

## 2011-02-28 NOTE — Telephone Encounter (Signed)
Pt states she is still dizzy and a little off.  Pt would like to know what the next step is.  Pt states she has been taking her iron pill and that has helped some. Pls advise.

## 2011-03-01 ENCOUNTER — Ambulatory Visit (INDEPENDENT_AMBULATORY_CARE_PROVIDER_SITE_OTHER): Payer: 59 | Admitting: General Surgery

## 2011-03-01 ENCOUNTER — Encounter: Payer: Self-pay | Admitting: Family Medicine

## 2011-03-01 ENCOUNTER — Encounter (INDEPENDENT_AMBULATORY_CARE_PROVIDER_SITE_OTHER): Payer: Self-pay | Admitting: General Surgery

## 2011-03-01 ENCOUNTER — Telehealth: Payer: Self-pay | Admitting: Family Medicine

## 2011-03-01 VITALS — BP 116/78 | HR 68 | Temp 97.0°F | Resp 16 | Ht 66.0 in | Wt 148.6 lb

## 2011-03-01 DIAGNOSIS — K429 Umbilical hernia without obstruction or gangrene: Secondary | ICD-10-CM

## 2011-03-01 NOTE — Telephone Encounter (Signed)
Message copied by Baldemar Friday on Thu Mar 01, 2011  2:55 PM ------      Message from: Gershon Crane A      Created: Mon Feb 26, 2011  5:30 AM       normal

## 2011-03-01 NOTE — Telephone Encounter (Signed)
Spoke with pt and gave info

## 2011-03-01 NOTE — Telephone Encounter (Signed)
I tried to call pt but no answer and no option to leave a message. I will put a copy of labs in mail.

## 2011-03-01 NOTE — Telephone Encounter (Signed)
I still think this is mostly due to allergies. Continue a daily antihistamine like Claritin or Zyrtec, but add Sudafed daily

## 2011-03-01 NOTE — Progress Notes (Signed)
Chief Complaint  Patient presents with  . Other    Eval umbilical hernia    HPI Rachel Gallagher is a 30 y.o. female.   HPI This is a 30 year old female who has had 3 pregnancies and 3 children. For about 12 years she has had an umbilical bulge that is not really bothered her. She reports no pain. She reports no changes in her bowel movements no nausea or vomiting. This area has become increasingly visible to her. She is now 2 months postpartum after having her son and a postpartum tubal ligation. She comes in having this evaluated for repair. She is referred from [name].  Past Medical History  Diagnosis Date  . Infection     pt has uti  . GERD (gastroesophageal reflux disease)   . Blood dyscrasia     Past Surgical History  Procedure Date  . Tubal ligation 12/24/2010    Procedure: POST PARTUM TUBAL LIGATION;  Surgeon: [name];  Location: [facility];  Service: Gynecology;  Laterality: Bilateral;    No family history on file.  Social History History  Substance Use Topics  . Smoking status: Never Smoker   . Smokeless tobacco: Never Used  . Alcohol Use: 0.5 oz/week    1 drink(s) per week    No Known Allergies  Current Outpatient Prescriptions  Medication Sig Dispense Refill  . famotidine (PEPCID AC) 10 MG chewable tablet Chew 10 mg by mouth as needed.        . calcium carbonate (TUMS - DOSED IN MG ELEMENTAL CALCIUM) 500 MG chewable tablet Chew 1 tablet by mouth daily.        . Multiple Vitamin (MULTIVITAMIN) capsule Take 1 capsule by mouth daily.          Review of Systems Review of Systems  Constitutional: Negative.   HENT: Negative.   Eyes: Negative.   Respiratory: Negative.   Cardiovascular: Negative.   Gastrointestinal: Negative.   Genitourinary: Negative.   Musculoskeletal: Negative.   Skin: Negative.   Neurological: Negative.   Hematological: Negative.   Psychiatric/Behavioral: Negative.     Blood pressure 116/78, pulse 68, temperature 97 F  (36.1 C), temperature source Temporal, resp. rate 16, height 5\' 6"  (1.676 m), weight 148 lb 9.6 oz (67.405 kg), not currently breastfeeding.  Physical Exam Physical Exam  Constitutional: She appears well-developed and well-nourished.  Eyes: No scleral icterus.  Cardiovascular: Normal rate, regular rhythm and normal heart sounds.   Pulmonary/Chest: Effort normal and breath sounds normal. She has no wheezes. She has no rales.  Abdominal: Soft. Bowel sounds are normal. She exhibits no distension and no mass. There is no tenderness. There is no rebound and no guarding. A hernia (reducible mildly tender about 3-4 cm umbilical hernia) is present.      Assessment    Umbilical hernia    Plan    We discussed the pathophysiology of an umbilical hernia. I think due to the size that this is the best way to approach this would be a laparoscopic repair with mesh. She is a pretty significant defect on her exam. She also has a lot of excess skin around this after 3 pregnancies. I told her that none of this will be addressed by hernia repair. I discussed an open versus a laparoscopic hernia repair. I think a recurrence rate from just a simple umbilical hernia apparently too high and she needs a larger piece of mesh and therefore this size hernia. I discussed a laparoscopic ventral  hernia repair with the wrist being but not limited to bleeding, infection, ileus requiring prolonged hospital stay, recurrence, pain. We discussed she would be in the hospital at least overnight and her postoperative restrictions as well. She is going to think about this is when she would like this repaired. I don't think it's an emergency. She is going to call me back leaving a when she like to have this repaired.       Rachel Gallagher 03/01/2011, 12:14 PM

## 2011-05-17 ENCOUNTER — Ambulatory Visit: Payer: 59 | Admitting: Family Medicine

## 2011-08-02 ENCOUNTER — Telehealth: Payer: Self-pay | Admitting: Family Medicine

## 2011-08-02 NOTE — Telephone Encounter (Signed)
Pt called and said that she went to a psychologist, Dr Cyndia Skeeters, who recommended pt to start taking Prozac, but pt wanted to get Dr Carver Fila input on this med? Pls call.

## 2011-08-03 NOTE — Telephone Encounter (Signed)
I think this sounds like a good idea. This is an effective med, and Dr. Cyndia Skeeters knows what he is doing

## 2011-08-03 NOTE — Telephone Encounter (Signed)
Spoke with pt and she is going to schedule a office visit to discuss starting on this medication.

## 2011-08-14 ENCOUNTER — Encounter (INDEPENDENT_AMBULATORY_CARE_PROVIDER_SITE_OTHER): Payer: Self-pay | Admitting: General Surgery

## 2011-08-14 ENCOUNTER — Ambulatory Visit (INDEPENDENT_AMBULATORY_CARE_PROVIDER_SITE_OTHER): Payer: 59 | Admitting: General Surgery

## 2011-08-14 DIAGNOSIS — K439 Ventral hernia without obstruction or gangrene: Secondary | ICD-10-CM

## 2011-08-14 NOTE — Progress Notes (Signed)
Patient ID: Rachel Gallagher, female   DOB: [birth year], 31 y.o.   MRN: [id number]  Chief Complaint  Patient presents with  . Umbilical Hernia    reck    HPI Rachel Gallagher is a 31 y.o. female.  HPI This is a 31 year old female who has had 3 pregnancies and 3 children. For about 12 years she has had an umbilical bulge that is not really bothered her. She reports no pain. She reports no changes in her bowel movements no nausea or vomiting. This area has become increasingly visible to her. She is now 2 months postpartum after having her son and a postpartum tubal ligation. She comes in having this evaluated for repair. She is referred from Dr. Gershon Gallagher.  Blood pressure 116/78, pulse 68, temperature 97 F (36.1 C), temperature source Temporal, resp. rate 16, height 5\' 6"  (1.676 m), weight 148 lb 9.6 oz (67.405 kg), not currently breastfeeding.      Past Medical History  Diagnosis Date  . Infection     pt has uti  . GERD (gastroesophageal reflux disease)   . Blood dyscrasia   . Umbilical hernia     Past Surgical History  Procedure Date  . Tubal ligation 12/24/2010    Procedure: POST PARTUM TUBAL LIGATION;  Surgeon: [name];  Location: [facility];  Service: Gynecology;  Laterality: Bilateral;    Family History  Problem Relation Age of Onset  . Heart disease Maternal Grandmother     Social History History  Substance Use Topics  . Smoking status: Never Smoker   . Smokeless tobacco: Never Used  . Alcohol Use: 0.5 oz/week    1 drink(s) per week    No Known Allergies  Current Outpatient Prescriptions  Medication Sig Dispense Refill  . calcium carbonate (TUMS - DOSED IN MG ELEMENTAL CALCIUM) 500 MG chewable tablet Chew 1 tablet by mouth daily.        . Multiple Vitamin (MULTIVITAMIN) capsule Take 1 capsule by mouth daily.        Marland Kitchen omeprazole (PRILOSEC) 20 MG capsule Take 20 mg by mouth daily.        Review of Systems Review of Systems  Constitutional: Negative for  fever, chills and unexpected weight change.  HENT: Negative for hearing loss, congestion, sore throat, trouble swallowing and voice change.   Eyes: Negative for visual disturbance.  Respiratory: Negative for cough and wheezing.   Cardiovascular: Negative for chest pain, palpitations and leg swelling.  Gastrointestinal: Negative for nausea, vomiting, abdominal pain, diarrhea, constipation, blood in stool, abdominal distention and anal bleeding.  Genitourinary: Negative for hematuria, vaginal bleeding and difficulty urinating.  Musculoskeletal: Negative for arthralgias.  Skin: Negative for rash and wound.  Neurological: Negative for seizures, syncope and headaches.  Hematological: Negative for adenopathy. Does not bruise/bleed easily.  Psychiatric/Behavioral: Negative for confusion.    Blood pressure 140/92, pulse 76, temperature 97.9 F (36.6 C), temperature source Temporal, resp. rate 16, height 5\' 6"  (1.676 m), weight 153 lb 3.2 oz (69.491 kg).  Physical Exam Physical Exam Physical Exam  Physical Exam  Constitutional: She appears well-developed and well-nourished.   Abdominal: Soft. Bowel sounds are normal. She exhibits no distension and no mass. There is no tenderness. There is no rebound and no guarding. A hernia (reducible mildly tender about 3-4 cm umbilical hernia) is present.      Assessment   Umbilical hernia   Plan   We discussed the pathophysiology of an umbilical hernia.I don't think this is  amenable to simple umbilical hernia repair.   She has a pretty significant defect on her exam. She also has a lot of excess skin around this after 3 pregnancies.Her main complaint is the bulge and the excess skin that is present. I told her that none of this will be addressed by hernia repair.I discussed it might be possible to perform a combined abdominoplasty and an open ventral hernia repair with mesh.  I will refer her to see plastic surgery for consideration of this procedure.  She  has some concerns over payment and insurance about this but is interested in discussing this.   I discussed an open versus a laparoscopic hernia repair. I think a recurrence rate from just a simple umbilical hernia apparently too high and she needs a larger piece of mesh and therefore this size hernia. I discussed a laparoscopic ventral hernia repair with the risks being but not limited to bleeding, infection, ileus requiring prolonged hospital stay, recurrence, pain. We discussed  her postoperative restrictions as well. She is going to see plastic surgery and then we will proceed.  If she decides to just undergo hernia repair she is interested in laparoscopy but I told her that would not address her excess skin.       [name] 08/14/2011, 11:30 AM

## 2011-09-05 ENCOUNTER — Telehealth (INDEPENDENT_AMBULATORY_CARE_PROVIDER_SITE_OTHER): Payer: Self-pay | Admitting: General Surgery

## 2011-09-05 NOTE — Telephone Encounter (Signed)
Pt calling to check on referral to plastic surgery.  She can be reached on her cell phone today.

## 2011-09-06 ENCOUNTER — Other Ambulatory Visit (INDEPENDENT_AMBULATORY_CARE_PROVIDER_SITE_OTHER): Payer: Self-pay | Admitting: General Surgery

## 2011-09-06 ENCOUNTER — Telehealth (INDEPENDENT_AMBULATORY_CARE_PROVIDER_SITE_OTHER): Payer: Self-pay | Admitting: General Surgery

## 2011-09-06 NOTE — Telephone Encounter (Signed)
Returned pt's call. I advised the pt that Dr Dwain Sarna did complete her orders which I took to our surgery schedulers for them to contact Dr Leonie Green office. I did explaine that we were waiting on Dr Leonie Green office to get her insurance to say if everything would be covered on the plastic surgery end. The pt understands.

## 2011-09-18 ENCOUNTER — Telehealth (INDEPENDENT_AMBULATORY_CARE_PROVIDER_SITE_OTHER): Payer: Self-pay | Admitting: General Surgery

## 2011-09-27 ENCOUNTER — Encounter (INDEPENDENT_AMBULATORY_CARE_PROVIDER_SITE_OTHER): Payer: 59 | Admitting: General Surgery

## 2011-10-01 ENCOUNTER — Encounter (INDEPENDENT_AMBULATORY_CARE_PROVIDER_SITE_OTHER): Payer: Self-pay | Admitting: General Surgery

## 2011-10-01 ENCOUNTER — Ambulatory Visit (INDEPENDENT_AMBULATORY_CARE_PROVIDER_SITE_OTHER): Payer: 59 | Admitting: General Surgery

## 2011-10-01 VITALS — BP 116/76 | HR 70 | Temp 97.7°F | Resp 16 | Ht 66.0 in | Wt 153.4 lb

## 2011-10-01 DIAGNOSIS — K429 Umbilical hernia without obstruction or gangrene: Secondary | ICD-10-CM

## 2011-10-01 NOTE — Progress Notes (Signed)
Subjective:     Patient ID: Rachel Gallagher, female   DOB: 06/13/1980, 31 y.o.   MRN: [id number]  HPI This is 31 yof who presents with > 10 year history of umbilical hernia after multiple pregnancies. She reports no changes in her hernia and is otherwise doing well.  I had her see plastic surgery for discussion of combined abdominoplasty and this will not be possible financially for her right now.  She returns today for discussion of repair.  Review of Systems     Objective:   Physical Exam 3 cm defect nonreducible at umbilicus with significant extra skin    Assessment:     Umbilical hernia    Plan:     I had a long talk with her again today.  We discussed pros and cons as well as recovery for laparoscopic vs open hernia repair.  I have thought laparoscopic repair would be best but that would not address some cosmetic issues.  We discussed today open repair with mesh and that is what she would prefer after discussing this.  i think this is reasonable as well at this point for all our goals.  We discussed risks of injury to bowel, bleeding, infection, and recurrence.  She is agreeable to proceeding with an open repair with mesh next week.      

## 2011-10-02 ENCOUNTER — Encounter (HOSPITAL_COMMUNITY): Payer: Self-pay | Admitting: Pharmacy Technician

## 2011-10-04 ENCOUNTER — Encounter (HOSPITAL_COMMUNITY): Payer: Self-pay

## 2011-10-04 ENCOUNTER — Encounter (HOSPITAL_COMMUNITY)
Admission: RE | Admit: 2011-10-04 | Discharge: 2011-10-04 | Disposition: A | Discharge status: Home / Self Care | Payer: 59 | Source: Ambulatory Visit | Attending: General Surgery | Admitting: General Surgery

## 2011-10-04 ENCOUNTER — Encounter (HOSPITAL_COMMUNITY): Admission: RE | Payer: Self-pay | Source: Ambulatory Visit

## 2011-10-04 ENCOUNTER — Ambulatory Visit (HOSPITAL_COMMUNITY): Admission: RE | Admit: 2011-10-04 | Payer: 59 | Source: Ambulatory Visit | Admitting: General Surgery

## 2011-10-04 HISTORY — DX: Diaphragmatic hernia without obstruction or gangrene: K44.9

## 2011-10-04 HISTORY — DX: Cardiac arrhythmia, unspecified: I49.9

## 2011-10-04 LAB — CBC
MCH: 30.3 pg (ref 26.0–34.0)
MCHC: 33.3 g/dL (ref 30.0–36.0)
Platelets: 246 10*3/uL (ref 150–400)
RBC: 4.46 MIL/uL (ref 3.87–5.11)
RDW: 12.4 % (ref 11.5–15.5)

## 2011-10-04 LAB — SURGICAL PCR SCREEN: MRSA, PCR: NEGATIVE

## 2011-10-04 SURGERY — REPAIR, HERNIA, UMBILICAL, ADULT
Anesthesia: General

## 2011-10-04 NOTE — Patient Instructions (Signed)
19 Rachel Gallagher  10/04/2011   Your procedure is scheduled on:  10/08/11   [facility]  Surgery [id number]  Report to [facility] at  0700     AM.  Call this number if you have problems the morning of surgery: [phone]     Or PST   [id number]  [facility]   Remember:   Do not eat food  Or drink any fluids :After Midnight.  Sunday NIGHT     Clear liquids include soda, tea, black coffee, apple or grape juice, broth.  Take these medicines the morning of surgery with A SIP OF WATER: PROLISEC   Do not wear jewelry, make-up or nail polish.  Do not wear lotions, powders, or perfumes. You may wear deodorant.  Do not shave 48 hours prior to surgery.  Do not bring valuables to the hospital.  Contacts, dentures or bridgework may not be worn into surgery.  Leave suitcase in the car. After surgery it may be brought to your room.  For patients admitted to the hospital, checkout time is 11:00 AM the day of discharge.   Patients discharged the day of surgery will not be allowed to drive home.  Name and phone number of your driver:    husband                                                                  Special Instructions: CHG Shower Use Special Wash: 1/2 bottle night before surgery and 1/2 bottle morning of surgery. REGULAR SOAP FACE AND PRIVATES              LADIES- NO SHAVING 48 HOURS BEFORE USING BETASEPT SOAP.                Please read over the following fact sheets that you were given: MRSA Information

## 2011-10-04 NOTE — Pre-Procedure Instructions (Signed)
Left voice mail message at [phone] (cell) to stop antiinflmmatories or aspirin products

## 2011-10-08 ENCOUNTER — Encounter (HOSPITAL_COMMUNITY): Payer: Self-pay | Admitting: *Deleted

## 2011-10-08 ENCOUNTER — Ambulatory Visit (HOSPITAL_COMMUNITY)
Admission: RE | Admit: 2011-10-08 | Discharge: 2011-10-08 | Disposition: A | Discharge status: Home / Self Care | Payer: 59 | Source: Ambulatory Visit | Attending: General Surgery | Admitting: General Surgery

## 2011-10-08 ENCOUNTER — Encounter (HOSPITAL_COMMUNITY): Payer: Self-pay | Admitting: Anesthesiology

## 2011-10-08 ENCOUNTER — Ambulatory Visit (HOSPITAL_COMMUNITY): Payer: 59 | Admitting: Anesthesiology

## 2011-10-08 ENCOUNTER — Encounter (HOSPITAL_COMMUNITY)
Admission: RE | Disposition: A | Discharge status: Home / Self Care | Payer: Self-pay | Source: Ambulatory Visit | Attending: General Surgery

## 2011-10-08 DIAGNOSIS — Z01810 Encounter for preprocedural cardiovascular examination: Secondary | ICD-10-CM | POA: Insufficient documentation

## 2011-10-08 DIAGNOSIS — Z01812 Encounter for preprocedural laboratory examination: Secondary | ICD-10-CM | POA: Insufficient documentation

## 2011-10-08 DIAGNOSIS — K429 Umbilical hernia without obstruction or gangrene: Secondary | ICD-10-CM

## 2011-10-08 HISTORY — PX: UMBILICAL HERNIA REPAIR: SHX196

## 2011-10-08 HISTORY — PX: HERNIA REPAIR: SHX51

## 2011-10-08 SURGERY — REPAIR, HERNIA, UMBILICAL, ADULT
Anesthesia: General | Site: Abdomen | Wound class: Clean

## 2011-10-08 MED ORDER — CEFAZOLIN SODIUM 1-5 GM-% IV SOLN
1.0000 g | INTRAVENOUS | Status: AC
  Administered 2011-10-08: 1 g via INTRAVENOUS

## 2011-10-08 MED ORDER — KETOROLAC TROMETHAMINE 30 MG/ML IJ SOLN
15.0000 mg | Freq: Once | INTRAMUSCULAR | Status: AC | PRN
  Administered 2011-10-08: 30 mg via INTRAVENOUS

## 2011-10-08 MED ORDER — FENTANYL CITRATE 0.05 MG/ML IJ SOLN
INTRAMUSCULAR | Status: AC
  Filled 2011-10-08: qty 2

## 2011-10-08 MED ORDER — SUCCINYLCHOLINE CHLORIDE 20 MG/ML IJ SOLN
INTRAMUSCULAR | Status: DC | PRN
  Administered 2011-10-08: 100 mg via INTRAVENOUS

## 2011-10-08 MED ORDER — BUPIVACAINE HCL (PF) 0.25 % IJ SOLN
INTRAMUSCULAR | Status: AC
Start: 2011-10-08 — End: 2011-10-08
  Filled 2011-10-08: qty 30

## 2011-10-08 MED ORDER — BUPIVACAINE HCL 0.25 % IJ SOLN
INTRAMUSCULAR | Status: DC | PRN
  Administered 2011-10-08: 13 mL

## 2011-10-08 MED ORDER — KETOROLAC TROMETHAMINE 30 MG/ML IJ SOLN
INTRAMUSCULAR | Status: AC
  Filled 2011-10-08: qty 1

## 2011-10-08 MED ORDER — SODIUM CHLORIDE 0.9 % IJ SOLN: 3.0000 mL | Freq: Two times a day (BID) | INTRAMUSCULAR | Status: DC

## 2011-10-08 MED ORDER — SODIUM CHLORIDE 0.9 % IJ SOLN: 3.0000 mL | INTRAMUSCULAR | Status: DC | PRN

## 2011-10-08 MED ORDER — ACETAMINOPHEN 325 MG PO TABS: 650.0000 mg | ORAL_TABLET | ORAL | Status: DC | PRN

## 2011-10-08 MED ORDER — MIDAZOLAM HCL 5 MG/5ML IJ SOLN
INTRAMUSCULAR | Status: DC | PRN
  Administered 2011-10-08: 2 mg via INTRAVENOUS

## 2011-10-08 MED ORDER — ACETAMINOPHEN 650 MG RE SUPP
650.0000 mg | RECTAL | Status: DC | PRN
  Filled 2011-10-08: qty 1

## 2011-10-08 MED ORDER — PROPOFOL 10 MG/ML IV EMUL
INTRAVENOUS | Status: DC | PRN
  Administered 2011-10-08: 160 mg via INTRAVENOUS

## 2011-10-08 MED ORDER — OXYCODONE-ACETAMINOPHEN 10-325 MG PO TABS: 1.0000 | ORAL_TABLET | Freq: Four times a day (QID) | ORAL | Status: DC | PRN

## 2011-10-08 MED ORDER — ACETAMINOPHEN 10 MG/ML IV SOLN
INTRAVENOUS | Status: AC
  Filled 2011-10-08: qty 100

## 2011-10-08 MED ORDER — DEXAMETHASONE SODIUM PHOSPHATE 4 MG/ML IJ SOLN
INTRAMUSCULAR | Status: DC | PRN
  Administered 2011-10-08: 8 mg via INTRAVENOUS

## 2011-10-08 MED ORDER — OXYCODONE HCL 5 MG PO TABS
5.0000 mg | ORAL_TABLET | ORAL | Status: DC | PRN
  Administered 2011-10-08 (×2): 5 mg via ORAL

## 2011-10-08 MED ORDER — GLYCOPYRROLATE 0.2 MG/ML IJ SOLN
INTRAMUSCULAR | Status: DC | PRN
  Administered 2011-10-08: 0.4 mg via INTRAVENOUS

## 2011-10-08 MED ORDER — OXYCODONE HCL 5 MG PO TABS
ORAL_TABLET | ORAL | Status: AC
  Filled 2011-10-08: qty 1

## 2011-10-08 MED ORDER — MORPHINE SULFATE 10 MG/ML IJ SOLN: 2.0000 mg | INTRAMUSCULAR | Status: DC | PRN

## 2011-10-08 MED ORDER — SODIUM CHLORIDE 0.9 % IV SOLN: 250.0000 mL | INTRAVENOUS | Status: DC | PRN

## 2011-10-08 MED ORDER — NEOSTIGMINE METHYLSULFATE 1 MG/ML IJ SOLN
INTRAMUSCULAR | Status: DC | PRN
  Administered 2011-10-08: 3 mg via INTRAVENOUS

## 2011-10-08 MED ORDER — ONDANSETRON HCL 4 MG/2ML IJ SOLN
INTRAMUSCULAR | Status: DC | PRN
  Administered 2011-10-08: 4 mg via INTRAVENOUS

## 2011-10-08 MED ORDER — PROMETHAZINE HCL 25 MG/ML IJ SOLN: 6.2500 mg | INTRAMUSCULAR | Status: DC | PRN

## 2011-10-08 MED ORDER — LACTATED RINGERS IV SOLN
INTRAVENOUS | Status: DC | PRN
  Administered 2011-10-08 (×2): via INTRAVENOUS

## 2011-10-08 MED ORDER — FENTANYL CITRATE 0.05 MG/ML IJ SOLN
25.0000 ug | INTRAMUSCULAR | Status: DC | PRN
  Administered 2011-10-08 (×2): 50 ug via INTRAVENOUS

## 2011-10-08 MED ORDER — ACETAMINOPHEN 10 MG/ML IV SOLN
INTRAVENOUS | Status: DC | PRN
  Administered 2011-10-08: 1000 mg via INTRAVENOUS

## 2011-10-08 MED ORDER — ONDANSETRON HCL 4 MG/2ML IJ SOLN: 4.0000 mg | Freq: Four times a day (QID) | INTRAMUSCULAR | Status: DC | PRN

## 2011-10-08 MED ORDER — CISATRACURIUM BESYLATE 2 MG/ML IV SOLN
INTRAVENOUS | Status: DC | PRN
  Administered 2011-10-08: 4 mg via INTRAVENOUS

## 2011-10-08 MED ORDER — CEFAZOLIN SODIUM 1-5 GM-% IV SOLN
INTRAVENOUS | Status: AC
  Filled 2011-10-08: qty 50

## 2011-10-08 MED ORDER — SUFENTANIL CITRATE 50 MCG/ML IV SOLN
INTRAVENOUS | Status: DC | PRN
  Administered 2011-10-08: 20 ug via INTRAVENOUS
  Administered 2011-10-08: 10 ug via INTRAVENOUS

## 2011-10-08 MED ORDER — LACTATED RINGERS IV SOLN: INTRAVENOUS | Status: DC

## 2011-10-08 MED ORDER — LIDOCAINE HCL (CARDIAC) 20 MG/ML IV SOLN
INTRAVENOUS | Status: DC | PRN
  Administered 2011-10-08: 100 mg via INTRAVENOUS

## 2011-10-08 MED ORDER — OXYCODONE-ACETAMINOPHEN 5-325 MG PO TABS: 1.0000 | ORAL_TABLET | Freq: Four times a day (QID) | ORAL | Status: DC | PRN

## 2011-10-08 SURGICAL SUPPLY — 43 items
ADH SKN CLS APL DERMABOND .7 (GAUZE/BANDAGES/DRESSINGS)
APL SKNCLS STERI-STRIP NONHPOA (GAUZE/BANDAGES/DRESSINGS) ×1
BENZOIN TINCTURE PRP APPL 2/3 (GAUZE/BANDAGES/DRESSINGS) ×1 IMPLANT
BLADE HEX COATED 2.75 (ELECTRODE) ×2 IMPLANT
BLADE SURG 15 STRL LF DISP TIS (BLADE) IMPLANT
BLADE SURG 15 STRL SS (BLADE) ×2
BLADE SURG SZ10 CARB STEEL (BLADE) ×2 IMPLANT
CANISTER SUCTION 2500CC (MISCELLANEOUS) ×2 IMPLANT
CLOTH BEACON ORANGE TIMEOUT ST (SAFETY) ×2 IMPLANT
CLSR STERI-STRIP ANTIMIC 1/2X4 (GAUZE/BANDAGES/DRESSINGS) ×1 IMPLANT
DECANTER SPIKE VIAL GLASS SM (MISCELLANEOUS) ×1 IMPLANT
DERMABOND ADVANCED (GAUZE/BANDAGES/DRESSINGS)
DERMABOND ADVANCED .7 DNX12 (GAUZE/BANDAGES/DRESSINGS) IMPLANT
DRAPE LAPAROSCOPIC ABDOMINAL (DRAPES) ×2 IMPLANT
DRSG TEGADERM 4X4.75 (GAUZE/BANDAGES/DRESSINGS) ×1 IMPLANT
ELECT REM PT RETURN 9FT ADLT (ELECTROSURGICAL) ×2
ELECTRODE REM PT RTRN 9FT ADLT (ELECTROSURGICAL) ×1 IMPLANT
GLOVE BIO SURGEON STRL SZ7 (GLOVE) ×6 IMPLANT
GLOVE BIOGEL PI IND STRL 7.5 (GLOVE) ×3 IMPLANT
GLOVE BIOGEL PI INDICATOR 7.5 (GLOVE) ×3
GOWN PREVENTION PLUS XLARGE (GOWN DISPOSABLE) ×1 IMPLANT
GOWN STRL NON-REIN LRG LVL3 (GOWN DISPOSABLE) ×2 IMPLANT
GOWN STRL REIN XL XLG (GOWN DISPOSABLE) ×2 IMPLANT
KIT BASIN OR (CUSTOM PROCEDURE TRAY) ×2 IMPLANT
NDL HYPO 25X1 1.5 SAFETY (NEEDLE) ×1 IMPLANT
NEEDLE HYPO 25X1 1.5 SAFETY (NEEDLE) ×2 IMPLANT
NS IRRIG 1000ML POUR BTL (IV SOLUTION) ×2 IMPLANT
PACK BASIC VI WITH GOWN DISP (CUSTOM PROCEDURE TRAY) ×1 IMPLANT
PACK GENERAL/GYN (CUSTOM PROCEDURE TRAY) ×1 IMPLANT
PATCH VENTRAL MEDIUM 6.4 (Mesh Specialty) ×1 IMPLANT
PENCIL BUTTON HOLSTER BLD 10FT (ELECTRODE) ×2 IMPLANT
SPONGE GAUZE 4X4 12PLY (GAUZE/BANDAGES/DRESSINGS) ×1 IMPLANT
STRIP CLOSURE SKIN 1/2X4 (GAUZE/BANDAGES/DRESSINGS) IMPLANT
SUT ETHIBOND NAB CT1 #1 30IN (SUTURE) ×7 IMPLANT
SUT MNCRL AB 4-0 PS2 18 (SUTURE) ×2 IMPLANT
SUT SILK 2 0 SH (SUTURE) IMPLANT
SUT VIC AB 3-0 SH 27 (SUTURE) ×2
SUT VIC AB 3-0 SH 27X BRD (SUTURE) IMPLANT
SYR BULB IRRIGATION 50ML (SYRINGE) ×2 IMPLANT
SYR CONTROL 10ML LL (SYRINGE) ×2 IMPLANT
TOWEL OR 17X26 10 PK STRL BLUE (TOWEL DISPOSABLE) ×2 IMPLANT
TOWEL OR NON WOVEN STRL DISP B (DISPOSABLE) ×1 IMPLANT
YANKAUER SUCT BULB TIP 10FT TU (MISCELLANEOUS) ×2 IMPLANT

## 2011-10-08 NOTE — Interval H&P Note (Signed)
History and Physical Interval Note:  10/08/2011 8:22 AM  Trellis Paganini  has presented today for surgery, with the diagnosis of umbilical hernia  The various methods of treatment have been discussed with the patient and family. After consideration of risks, benefits and other options for treatment, the patient has consented to  Procedure(s) (LRB): HERNIA REPAIR UMBILICAL ADULT (N/A) INSERTION OF MESH (N/A) as a surgical intervention .  The patients' history has been reviewed, patient examined, no change in status, stable for surgery.  I have reviewed the patients' chart and labs.  Questions were answered to the patient's satisfaction.     Sefora Tietje

## 2011-10-08 NOTE — Anesthesia Preprocedure Evaluation (Addendum)
Anesthesia Evaluation  Patient identified by MRN, date of birth, ID band Patient awake    Reviewed: Allergy & Precautions, H&P , NPO status , Patient's Chart, lab work & pertinent test results  Airway Mallampati: II TM Distance: >3 FB Neck ROM: Full    Dental No notable dental hx.    Pulmonary neg pulmonary ROS,  breath sounds clear to auscultation  Pulmonary exam normal       Cardiovascular negative cardio ROS  Rhythm:Regular Rate:Normal     Neuro/Psych negative neurological ROS  negative psych ROS   GI/Hepatic negative GI ROS, Neg liver ROS, hiatal hernia, GERD-  ,  Endo/Other  negative endocrine ROS  Renal/GU negative Renal ROS  negative genitourinary   Musculoskeletal negative musculoskeletal ROS (+)   Abdominal   Peds negative pediatric ROS (+)  Hematology negative hematology ROS (+)   Anesthesia Other Findings   Reproductive/Obstetrics negative OB ROS                           Anesthesia Physical Anesthesia Plan  ASA: II  Anesthesia Plan: General   Post-op Pain Management:    Induction: Intravenous  Airway Management Planned: LMA and Oral ETT  Additional Equipment:   Intra-op Plan:   Post-operative Plan: Extubation in OR  Informed Consent: I have reviewed the patients History and Physical, chart, labs and discussed the procedure including the risks, benefits and alternatives for the proposed anesthesia with the patient or authorized representative who has indicated his/her understanding and acceptance.   Dental advisory given  Plan Discussed with: CRNA  Anesthesia Plan Comments:         Anesthesia Quick Evaluation

## 2011-10-08 NOTE — H&P (View-Only) (Signed)
Subjective:     Patient ID: Rachel Gallagher, female   DOB: 10-22-1980, 31 y.o.   MRN: [id number]  HPI This is 31 yof who presents with > 10 year history of umbilical hernia after multiple pregnancies. She reports no changes in her hernia and is otherwise doing well.  I had her see plastic surgery for discussion of combined abdominoplasty and this will not be possible financially for her right now.  She returns today for discussion of repair.  Review of Systems     Objective:   Physical Exam 3 cm defect nonreducible at umbilicus with significant extra skin    Assessment:     Umbilical hernia    Plan:     I had a long talk with her again today.  We discussed pros and cons as well as recovery for laparoscopic vs open hernia repair.  I have thought laparoscopic repair would be best but that would not address some cosmetic issues.  We discussed today open repair with mesh and that is what she would prefer after discussing this.  i think this is reasonable as well at this point for all our goals.  We discussed risks of injury to bowel, bleeding, infection, and recurrence.  She is agreeable to proceeding with an open repair with mesh next week.

## 2011-10-08 NOTE — Preoperative (Signed)
Beta Blockers   Reason not to administer Beta Blockers:Not Applicable 

## 2011-10-08 NOTE — Anesthesia Postprocedure Evaluation (Signed)
  Anesthesia Post-op Note  Patient: Rachel Gallagher  Procedure(s) Performed: Procedure(s) (LRB): HERNIA REPAIR UMBILICAL ADULT (N/A) INSERTION OF MESH (N/A)  Patient Location: PACU  Anesthesia Type: General  Level of Consciousness: awake and alert   Airway and Oxygen Therapy: Patient Spontanous Breathing  Post-op Pain: mild  Post-op Assessment: Post-op Vital signs reviewed, Patient's Cardiovascular Status Stable, Respiratory Function Stable, Patent Airway and No signs of Nausea or vomiting  Post-op Vital Signs: stable  Complications: No apparent anesthesia complications

## 2011-10-08 NOTE — Transfer of Care (Signed)
Immediate Anesthesia Transfer of Care Note  Patient: Rachel Gallagher  Procedure(s) Performed: Procedure(s) (LRB): HERNIA REPAIR UMBILICAL ADULT (N/A) INSERTION OF MESH (N/A)  Patient Location: PACU  Anesthesia Type: General  Level of Consciousness: sedated  Airway & Oxygen Therapy: Patient Spontanous Breathing and Patient connected to face mask oxygen  Post-op Assessment: Report given to PACU RN and Post -op Vital signs reviewed and stable  Post vital signs: Reviewed and stable  Complications: No apparent anesthesia complications

## 2011-10-08 NOTE — Anesthesia Procedure Notes (Signed)
Procedure Name: Intubation Date/Time: 10/08/2011 8:49 AM Performed by: Rachel Gallagher L Patient Re-evaluated:Patient Re-evaluated prior to inductionOxygen Delivery Method: Circle system utilized Preoxygenation: Pre-oxygenation with 100% oxygen Intubation Type: IV induction Ventilation: Mask ventilation without difficulty and Oral airway inserted - appropriate to patient size Laryngoscope Size: Hyacinth Meeker and 2 Grade View: Grade I Tube type: Oral Tube size: 7.5 mm Number of attempts: 1 Airway Equipment and Method: Stylet Placement Confirmation: ETT inserted through vocal cords under direct vision,  breath sounds checked- equal and bilateral and positive ETCO2 Secured at: 21 cm Tube secured with: Tape Dental Injury: Teeth and Oropharynx as per pre-operative assessment

## 2011-10-08 NOTE — Discharge Instructions (Signed)
[facility] ° °UMBILICAL OR INGUINAL HERNIA REPAIR: POST OP INSTRUCTIONS ° °Always review your discharge instruction sheet given to you by the facility where your surgery was performed. °IF YOU HAVE DISABILITY OR FAMILY LEAVE FORMS, YOU MUST BRING THEM TO THE OFFICE FOR PROCESSING.   °DO NOT GIVE THEM TO YOUR DOCTOR. ° °1. A  prescription for pain medication may be given to you upon discharge.  Take your pain medication as prescribed, if needed.  If narcotic pain medicine is not needed, then you may take acetaminophen (Tylenol), naprosyn (Alleve) or ibuprofen (Advil) as needed. °2. Take your usually prescribed medications unless otherwise directed. °3. If you need a refill on your pain medication, please contact your pharmacy.  They will contact our office to request authorization. Prescriptions will not be filled after 5 pm or on week-ends. °4. You should follow a light diet the first 24 hours after arrival home, such as soup and crackers, etc.  Be sure to include lots of fluids daily.  Resume your normal diet the day after surgery. °5. Most patients will experience some swelling and bruising around the umbilicus or in the groin and scrotum.  Ice packs and reclining will help.  Swelling and bruising can take several days to resolve.  °6. It is common to experience some constipation if taking pain medication after surgery.  Increasing fluid intake and taking a stool softener (such as Colace) will usually help or prevent this problem from occurring.  A mild laxative (Milk of Magnesia or Miralax) should be taken according to package directions if there are no bowel movements after 48 hours. °7. Unless discharge instructions indicate otherwise, you may remove your bandages 48 hours after surgery, and you may shower at that time.  You may have steri-strips (small skin tapes) in place directly over the incision.  These strips should be left on the skin for 7-10 days and will come off on their own.   If your surgeon used skin glue on the incision, you may shower in 24 hours.  The glue will flake off over the next 2-3 weeks.  Any sutures or staples will be removed at the office during your follow-up visit. °8. ACTIVITIES:  You may resume regular (light) daily activities beginning the next day--such as daily self-care, walking, climbing stairs--gradually increasing activities as tolerated.  You may have sexual intercourse when it is comfortable.  Refrain from any heavy lifting or straining until approved by your doctor. °a. You may drive when you are no longer taking prescription pain medication, you can comfortably wear a seatbelt, and you can safely maneuver your car and apply brakes. °b. RETURN TO WORK:  __________________________________________________________ °9. You should see your doctor in the office for a follow-up appointment approximately 2-3 weeks after your surgery.  Make sure that you call for this appointment within a day or two after you arrive home to insure a convenient appointment time. °10. OTHER INSTRUCTIONS:  __________________________________________________________________________________________________________________________________________________________________________________________  °WHEN TO CALL YOUR DOCTOR: °1. Fever over 101.0 °2. Inability to urinate °3. Nausea and/or vomiting °4. Extreme swelling or bruising °5. Continued bleeding from incision. °6. Increased pain, redness, or drainage from the incision ° °The clinic staff is available to answer your questions during regular business hours.  Please don’t hesitate to call and ask to speak to one of the nurses for clinical concerns.  If you have a medical emergency, go to the nearest emergency room or call 911.  A surgeon from [facility]   is always on call at the hospital ° ° °[address], [address], [place], [place]  [address] ? ° [address] [address], [place], [place]   [address] °[phone] ? [phone] ? FAX  [phone] °Web site: [email or web address] ° ° °

## 2011-10-08 NOTE — Op Note (Signed)
Preoperative diagnosis: Umbilical hernia Postoperative diagnosis: Same as above Procedure: Umbilical hernia repair with 6.4 cm proceed ventral patch Surgeon: Dr. Harden Mo Anesthesia: Gen. Endotracheal Estimated blood loss: Minimal Complications: None Specimens: None Disposition to recovery in stable condition Sponge count correct x2 Operation  Indications: This is a 31 year old female with a symptomatic umbilical hernia as well as a fair amount of abdominal skin and soft tissue. She was seen in conjunction with plastic surgery for possible abdominoplasty in combination with umbilical hernia repair but decided not to pursue this. She not been discussed an open umbilical hernia repair with mesh. We discussed laparoscopic versus open surgery I think that the best for her recovery will be an open umbilical hernia repair. We discussed the risks  and benefits prior to beginning.  Procedure: After informed consent was obtained the patient was taken to the operating room. She was administered1 g of intravenous cefazolin. Sequential compression devices were placed on her legs prior to beginning. She was then placed under general endotracheal anesthesia without complication. Her abdomen was then prepped and draped in the standard sterile surgical fashion. Surgical timeout was then performed.  I then infiltrated quarter percent Marcaine around her umbilicus. A curvilinear incision was then made below the umbilicus. I then carried this out down to the umbilical stalk. This was divided. This was already intraperitoneal upon doing this. I placed a piece of 6.4 cm proceed ventral patch into position intraperitoneally. This laid flat and was adherent to her abdominal wall. I sutured this twice at the base of the connecting straps to the fascia with #1 ethibond suture. These were all tied down. I then cut the straps and tacked them down with #1 Ethibond suture as well. I then closed the fascia above this with  #1 Ethibond suture as well. This gave a good repair. The mesh was comfortably up against the abdominal wall. I then tacked the umbilicus down with a 3-0 undyed Vicryl. I closed this with 3-0 Vicryl, 4-0 Monocryl. I then placed airstrips a sterile dressing. She tolerated this well was extubated and transferred to recovery in stable condition.

## 2011-10-09 ENCOUNTER — Encounter (HOSPITAL_COMMUNITY): Payer: Self-pay | Admitting: General Surgery

## 2011-10-10 ENCOUNTER — Telehealth (INDEPENDENT_AMBULATORY_CARE_PROVIDER_SITE_OTHER): Payer: Self-pay | Admitting: General Surgery

## 2011-10-17 ENCOUNTER — Telehealth (INDEPENDENT_AMBULATORY_CARE_PROVIDER_SITE_OTHER): Payer: Self-pay | Admitting: General Surgery

## 2011-10-17 ENCOUNTER — Ambulatory Visit (INDEPENDENT_AMBULATORY_CARE_PROVIDER_SITE_OTHER): Payer: 59 | Admitting: General Surgery

## 2011-10-17 ENCOUNTER — Encounter (INDEPENDENT_AMBULATORY_CARE_PROVIDER_SITE_OTHER): Payer: Self-pay | Admitting: General Surgery

## 2011-10-17 VITALS — BP 114/72 | HR 68 | Temp 97.3°F | Resp 14 | Ht 66.0 in | Wt 148.1 lb

## 2011-10-17 DIAGNOSIS — Z09 Encounter for follow-up examination after completed treatment for conditions other than malignant neoplasm: Secondary | ICD-10-CM

## 2011-10-17 NOTE — Progress Notes (Signed)
Subjective:     Patient ID: Rachel Gallagher, female   DOB: [birth year], 31 y.o.   MRN: [id number]  HPI 10 yof who I recently performed an umbilical hernia repair with mesh.  She has a lot of extra skin and a large space that I did dissection. Since taking her bandage off she has noticed increased swelling above her umbilicus.  She had some nausea,vomiting and diarrhea yesterday as well.  Otherwise she is doing well except for the swelling.  Review of Systems     Objective:   Physical Exam Healing incision, above umbilicus there is what appears to be large seroma that is in two places primarily, no infection    Assessment:     Seroma after umbilical hernia repair     Plan:     I think she just has a symptomatic seroma.  It sounds like she has also had a viral illness also.  I discussed with her this would resolve over some time and asked her to call if it got worse.  I will see her back soon

## 2011-10-29 ENCOUNTER — Encounter (INDEPENDENT_AMBULATORY_CARE_PROVIDER_SITE_OTHER): Payer: Self-pay | Admitting: General Surgery

## 2011-10-29 ENCOUNTER — Ambulatory Visit (INDEPENDENT_AMBULATORY_CARE_PROVIDER_SITE_OTHER): Payer: 59 | Admitting: General Surgery

## 2011-10-29 VITALS — BP 102/68 | HR 76 | Temp 97.4°F | Resp 16 | Ht 66.0 in | Wt 151.8 lb

## 2011-10-29 DIAGNOSIS — Z09 Encounter for follow-up examination after completed treatment for conditions other than malignant neoplasm: Secondary | ICD-10-CM

## 2011-10-30 NOTE — Progress Notes (Signed)
Subjective:     Patient ID: Rachel Gallagher, female   DOB: 01-Dec-1980, 31 y.o.   MRN: [id number]  HPI 68 yof with umbilical hernia and significant amount of abdominal wall laxity from pregnancies.  I had her see plastics for possible combined procedure but wasn't able to do this secondary to finances.  I then repaired umbilical hernia (which was not large) with PVP.  Postoperatively she had a viral illness and was having emesis which created pain at surgical site. It also left a hard lump in that area that I think is just a seroma.  This area is much better since our last visit but still present.  She is having normal bms and eating well. She feels well today  Review of Systems     Objective:   Physical Exam Incision without infection, hardened seroma at surgical site, nontender    Assessment:     S/p uh repair    Plan:     I think this is just a seroma.  There is a chance she could have disrupted mesh repair with emesis but right now she is better and I think I will watch this.  If she gets worse or doesn't continue to get better I will send her to get a ct scan.  I will see her next week.

## 2011-11-08 ENCOUNTER — Ambulatory Visit (INDEPENDENT_AMBULATORY_CARE_PROVIDER_SITE_OTHER): Payer: 59 | Admitting: General Surgery

## 2011-11-08 ENCOUNTER — Encounter (INDEPENDENT_AMBULATORY_CARE_PROVIDER_SITE_OTHER): Payer: Self-pay | Admitting: General Surgery

## 2011-11-08 VITALS — BP 112/82 | HR 79 | Temp 97.9°F | Ht 66.0 in | Wt 151.8 lb

## 2011-11-08 DIAGNOSIS — Z09 Encounter for follow-up examination after completed treatment for conditions other than malignant neoplasm: Secondary | ICD-10-CM

## 2011-11-08 NOTE — Progress Notes (Signed)
Subjective:     Patient ID: MILO SOLANA, female   DOB: 21-Oct-1980, 31 y.o.   MRN: [id number]  HPI 31 yof with umbilical hernia and significant amount of abdominal wall laxity from pregnancies. I had her see plastics for possible combined procedure but wasn't able to do this secondary to finances. I then repaired umbilical hernia (which was not large) with PVP. Postoperatively she had a viral illness and was having emesis which created pain at surgical site. It also left a hard lump in that area that I think is just a seroma. This area is the same since our last visit.  She reports normal bms and eating well.  Not very tender at site   Review of Systems     Objective:   Physical Exam Healed incision with hard mass above umbilicus and bulge, ? Seroma vs recurrence vs disruption of mesh, mild tender    Assessment:     S/p UH repair    Plan:     I would like to send her to get ct scan to assess for any surgical complications due to postop course. I will see her back after that

## 2011-11-09 ENCOUNTER — Ambulatory Visit
Admission: RE | Admit: 2011-11-09 | Discharge: 2011-11-09 | Disposition: A | Discharge status: Home / Self Care | Payer: 59 | Source: Ambulatory Visit | Attending: General Surgery | Admitting: General Surgery

## 2011-11-09 DIAGNOSIS — Z09 Encounter for follow-up examination after completed treatment for conditions other than malignant neoplasm: Secondary | ICD-10-CM

## 2011-11-09 MED ORDER — IOHEXOL 300 MG/ML  SOLN
100.0000 mL | Freq: Once | INTRAMUSCULAR | Status: AC | PRN
  Administered 2011-11-09: 100 mL via INTRAVENOUS

## 2011-11-11 ENCOUNTER — Encounter (HOSPITAL_COMMUNITY): Payer: Self-pay

## 2011-11-11 ENCOUNTER — Emergency Department (INDEPENDENT_AMBULATORY_CARE_PROVIDER_SITE_OTHER)
Admission: EM | Admit: 2011-11-11 | Discharge: 2011-11-11 | Disposition: A | Discharge status: Home / Self Care | Payer: 59 | Source: Home / Self Care | Attending: Family Medicine | Admitting: Family Medicine

## 2011-11-11 DIAGNOSIS — L500 Allergic urticaria: Secondary | ICD-10-CM

## 2011-11-11 DIAGNOSIS — L509 Urticaria, unspecified: Secondary | ICD-10-CM

## 2011-11-11 DIAGNOSIS — Z91041 Radiographic dye allergy status: Secondary | ICD-10-CM

## 2011-11-11 DIAGNOSIS — T7840XA Allergy, unspecified, initial encounter: Secondary | ICD-10-CM

## 2011-11-11 DIAGNOSIS — T50995A Adverse effect of other drugs, medicaments and biological substances, initial encounter: Secondary | ICD-10-CM

## 2011-11-11 LAB — POCT I-STAT, CHEM 8
BUN: 14 mg/dL (ref 6–23)
Calcium, Ion: 1.24 mmol/L (ref 1.12–1.32)
Chloride: 105 mEq/L (ref 96–112)
Glucose, Bld: 93 mg/dL (ref 70–99)

## 2011-11-11 MED ORDER — HYDROXYZINE HCL 50 MG PO TABS: 50.0000 mg | ORAL_TABLET | Freq: Three times a day (TID) | ORAL | Status: AC | PRN

## 2011-11-11 MED ORDER — DIPHENHYDRAMINE HCL 50 MG/ML IJ SOLN
30.0000 mg | Freq: Once | INTRAMUSCULAR | Status: AC
  Administered 2011-11-11: 30 mg via INTRAMUSCULAR

## 2011-11-11 MED ORDER — DIPHENHYDRAMINE HCL 50 MG/ML IJ SOLN
INTRAMUSCULAR | Status: AC
  Filled 2011-11-11: qty 1

## 2011-11-11 MED ORDER — PREDNISONE 20 MG PO TABS: ORAL_TABLET | ORAL | Status: AC

## 2011-11-11 MED ORDER — RANITIDINE HCL 150 MG PO CAPS: 150.0000 mg | ORAL_CAPSULE | Freq: Every day | ORAL | Status: DC

## 2011-11-11 NOTE — Discharge Instructions (Signed)
Take the prescribed medications as instructed. Make sure to list contrast in your allergy list. As you might need to be premedicated with an antiallergy medications before any future contrast use if needed. Go to the emergency department if worsening symptoms like difficulty breathing, swallowing or wheezing.

## 2011-11-11 NOTE — ED Notes (Signed)
Pt had CT scan of abd on Friday at [facility] and broke out in fine, itchy rash on Sat.  Rash covers entire body and states she has no prior hx of allergies and went [facility] and was told to come here for evaluation.

## 2011-11-12 NOTE — ED Provider Notes (Signed)
History     CSN: [id number]  Arrival date & time 11/11/11  1157   First MD Initiated Contact with Patient 11/11/11 1226      Chief Complaint  Patient presents with  . Rash    (Consider location/radiation/quality/duration/timing/severity/associated sxs/prior treatment) HPI Comments: 13 to old female with no significant past medical history other than a recent CT scan study 2 days ago for evolution of a abdominal hernia. Patient states she required contrast for her CT scan and that next a after her study started to develop generalized rash and itchiness worse in the face. Has not taken any medication for her symptoms. Denies throat discomfort. Difficulty breathing or swallowing. No shortness of breath or wheezing. No leg swelling. No headache dizziness or chest pain. She reports that she had minimal skin itchiness right after her CT scan study.   Past Medical History  Diagnosis Date  . Infection     UTI 7/12 end of preganacy  . GERD (gastroesophageal reflux disease)   . Dysrhythmia     irregular as a child  . Hiatal hernia     Past Surgical History  Procedure Date  . Tubal ligation 12/24/2010    Procedure: POST PARTUM TUBAL LIGATION;  Surgeon: [name];  Location: [facility];  Service: Gynecology;  Laterality: Bilateral;  . Wisdom tooth extraction   . Umbilical hernia repair 10/08/2011    Procedure: HERNIA REPAIR UMBILICAL ADULT;  Surgeon: Emelia Loron, MD;  Location: [facility];  Service: General;  Laterality: N/A;  . Hernia repair 10/08/11    umbilical hernia repair    Family History  Problem Relation Age of Onset  . Heart disease Maternal Grandmother     History  Substance Use Topics  . Smoking status: Never Smoker   . Smokeless tobacco: Never Used  . Alcohol Use: 0.5 oz/week    1 drink(s) per week     3 mixed drinks week    OB History    Grav Para Term Preterm Abortions TAB SAB Ect Mult Living   3 3 3  0 0 0 0 0 0 2      Review of Systems  Constitutional:  Negative for fever, chills and appetite change.  HENT: Negative for congestion, sore throat, facial swelling, rhinorrhea, mouth sores, trouble swallowing, neck pain, neck stiffness and voice change.   Respiratory: Negative for cough, wheezing and stridor.   Cardiovascular: Negative for chest pain and leg swelling.  Gastrointestinal: Negative for nausea, vomiting, abdominal pain and diarrhea.  Musculoskeletal: Negative for joint swelling and arthralgias.  Skin: Positive for rash.  Neurological: Negative for dizziness and headaches.    Allergies  Review of patient's allergies indicates no known allergies.  Home Medications   Current Outpatient Rx  Name Route Sig Dispense Refill  . HYDROXYZINE HCL 50 MG PO TABS Oral Take 1 tablet (50 mg total) by mouth every 8 (eight) hours as needed for itching. 20 tablet 0  . OMEPRAZOLE 20 MG PO CPDR Oral Take 20 mg by mouth daily as needed. Heart burn     . PREDNISONE 20 MG PO TABS  2 tabs po daily for 5 days 10 tablet no  . RANITIDINE HCL 150 MG PO CAPS Oral Take 1 capsule (150 mg total) by mouth daily. 30 capsule 0    BP 123/79  Pulse 79  Temp(Src) 98.5 F (36.9 C) (Oral)  Resp 18  SpO2 99%  LMP 11/06/2011  Breastfeeding? No  Physical Exam  Nursing note and vitals reviewed.  Constitutional: She is oriented to person, place, and time. She appears well-developed and well-nourished. No distress.  HENT:  Head: Normocephalic and atraumatic.  Right Ear: External ear normal.  Left Ear: External ear normal.  Nose: Nose normal.  Mouth/Throat: Oropharynx is clear and moist. No oropharyngeal exudate.  Eyes: Conjunctivae and EOM are normal. Pupils are equal, round, and reactive to light. Right eye exhibits no discharge. Left eye exhibits no discharge. No scleral icterus.  Neck: Normal range of motion. Neck supple. No JVD present.  Cardiovascular: Normal rate, regular rhythm and normal heart sounds.  Exam reveals no gallop and no friction rub.   No  murmur heard. Pulmonary/Chest: Effort normal and breath sounds normal. No respiratory distress. She has no wheezes. She has no rales. She exhibits no tenderness.  Abdominal: Soft. Bowel sounds are normal. There is no tenderness.  Lymphadenopathy:    She has no cervical adenopathy.  Neurological: She is alert and oriented to person, place, and time.  Skin: Rash noted.       Generalized papule erythematosus pruriginous rash. More confluent in face and upper extremities. No ulcers or blisters. No mucosal involvement. No involvement of palms  Or soles.    ED Course  Procedures (including critical care time)  Labs Reviewed  POCT I-STAT, CHEM 8 - Abnormal; Notable for the following:    Potassium 3.3 (*)    Hemoglobin 15.6 (*)    All other components within normal limits  LAB REPORT - SCANNED   No results found.   1. Urticaria   2. Allergy to contrast media (used for diagnostic x-rays)       MDM  Symptoms improved after IM diphenhydramine 30 mg x1. I stat normal electrolytes and creatinine. Prescribed hydroxyzine, prednisone and ranitidine oral. Asked to return if persistent or worsening symptoms like difficulty breathing.        Sharin Grave, MD 11/12/11 1125

## 2011-11-13 ENCOUNTER — Telehealth (INDEPENDENT_AMBULATORY_CARE_PROVIDER_SITE_OTHER): Payer: Self-pay | Admitting: General Surgery

## 2011-11-13 NOTE — Telephone Encounter (Signed)
Patient made aware of CT results. Will call with any problems and keep follow up appt. She did note that she is allergic to the contrast media. I will add to her allergy list.

## 2011-11-13 NOTE — Telephone Encounter (Signed)
Message copied by Liliana Cline on Tue Nov 13, 2011 11:45 AM ------      Message from: [place], [place]      Created: Tue Nov 13, 2011 11:20 AM       This all looks ok if you would let her know please.  She should be seeing me sometime in a couple of weeks      ----- Message -----         From: Rad Results In Interface         Sent: 11/09/2011   3:39 PM           To: Emelia Loron, MD

## 2011-11-27 ENCOUNTER — Encounter (INDEPENDENT_AMBULATORY_CARE_PROVIDER_SITE_OTHER): Payer: 59 | Admitting: General Surgery

## 2012-01-01 ENCOUNTER — Ambulatory Visit: Payer: 59 | Admitting: Family Medicine

## 2012-01-01 ENCOUNTER — Encounter: Payer: Self-pay | Admitting: Family Medicine

## 2012-01-01 ENCOUNTER — Ambulatory Visit (INDEPENDENT_AMBULATORY_CARE_PROVIDER_SITE_OTHER): Payer: 59 | Admitting: Family Medicine

## 2012-01-01 ENCOUNTER — Ambulatory Visit (INDEPENDENT_AMBULATORY_CARE_PROVIDER_SITE_OTHER)
Admission: RE | Admit: 2012-01-01 | Discharge: 2012-01-01 | Disposition: A | Discharge status: Home / Self Care | Payer: 59 | Source: Ambulatory Visit | Attending: Family Medicine | Admitting: Family Medicine

## 2012-01-01 VITALS — BP 108/68 | HR 73 | Temp 98.6°F | Wt 149.0 lb

## 2012-01-01 DIAGNOSIS — S92919A Unspecified fracture of unspecified toe(s), initial encounter for closed fracture: Secondary | ICD-10-CM

## 2012-01-01 NOTE — Progress Notes (Signed)
  Subjective:    Patient ID: Rachel Gallagher, female    DOB: 31-Aug-1980, 31 y.o.   MRN: [id number]  HPI Here for an injury to the 5th right toe at home yesterday. She struck it against a bedframe. She has used ice and Motrin.    Review of Systems  Constitutional: Negative.   Musculoskeletal: Positive for joint swelling and arthralgias.       Objective:   Physical Exam  Constitutional: She appears well-developed and well-nourished. No distress.  Musculoskeletal:       The right 5th toe is swollen and tender, slightly angulated laterally          Assessment & Plan:  Probable toe fracture. Send her over for Xrays this morning.

## 2012-01-02 ENCOUNTER — Telehealth: Payer: Self-pay | Admitting: Family Medicine

## 2012-01-02 NOTE — Telephone Encounter (Signed)
I spoke with pt and gave results.  

## 2012-01-02 NOTE — Telephone Encounter (Signed)
Pt is requesting xray results.

## 2012-01-02 NOTE — Progress Notes (Signed)
Quick Note:  I spoke with pt ______ 

## 2012-08-26 ENCOUNTER — Ambulatory Visit (INDEPENDENT_AMBULATORY_CARE_PROVIDER_SITE_OTHER): Payer: 59 | Admitting: Family Medicine

## 2012-08-26 ENCOUNTER — Encounter: Payer: Self-pay | Admitting: Family Medicine

## 2012-08-26 VITALS — BP 110/68 | HR 97 | Temp 99.5°F | Wt 142.0 lb

## 2012-08-26 DIAGNOSIS — J019 Acute sinusitis, unspecified: Secondary | ICD-10-CM

## 2012-08-26 MED ORDER — AZITHROMYCIN 250 MG PO TABS: ORAL_TABLET | ORAL | Status: DC

## 2012-08-27 ENCOUNTER — Encounter: Payer: Self-pay | Admitting: Family Medicine

## 2012-08-27 NOTE — Progress Notes (Signed)
  Subjective:    Patient ID: Rachel Gallagher, female    DOB: 1980/08/31, 32 y.o.   MRN: [id number]  HPI Here for 3 days of sinus pressure, PND, ST, hoarseness and a dry cough. No fever.    Review of Systems  Constitutional: Negative.   HENT: Positive for congestion, sore throat, voice change, postnasal drip and sinus pressure.   Eyes: Negative.   Respiratory: Positive for cough.        Objective:   Physical Exam  Constitutional: She appears well-developed and well-nourished.  HENT:  Right Ear: External ear normal.  Left Ear: External ear normal.  Nose: Nose normal.  Mouth/Throat: Oropharynx is clear and moist.  Eyes: Conjunctivae are normal.  Pulmonary/Chest: Effort normal and breath sounds normal.  Lymphadenopathy:    She has no cervical adenopathy.          Assessment & Plan:  Out of work 08-25-12 through 08-27-12.

## 2012-09-02 ENCOUNTER — Telehealth: Payer: Self-pay | Admitting: Family Medicine

## 2012-09-02 NOTE — Telephone Encounter (Signed)
Patient calling in states that she is currently taking prilosec as prescribed for her reflux.  States that it was working for a while however now feels like she needs something stronger or more frequent.  Patient takes Prilosec once a day at 11:00 and it is no longer working throughout the day and by morning has completely worn off and is feeling sick.    OFFICE NOTE: PLEASE FOLLOW UP WITH MEDICATION CHANGES OR FURTHER INSTRUCTION IF OFFICE VISIT IS RECOMMENDED.

## 2012-09-02 NOTE — Telephone Encounter (Signed)
I spoke with pt  

## 2012-09-02 NOTE — Telephone Encounter (Signed)
Pls advise.  

## 2012-09-02 NOTE — Telephone Encounter (Signed)
Tell her to take two Prilosec pills at the same time (total of 40 mg) for a week and see if this works better

## 2012-09-12 NOTE — Telephone Encounter (Signed)
Patient states she was told to call back in about a week to report if she was able to tolerate prilosec and if she she would be given a rx/  Pt reports she is tolerating the medication well and would like a script sent to [facility].

## 2012-09-17 ENCOUNTER — Telehealth: Payer: Self-pay | Admitting: Family Medicine

## 2012-09-17 MED ORDER — OMEPRAZOLE 40 MG PO CPDR: 40.0000 mg | DELAYED_RELEASE_CAPSULE | Freq: Every day | ORAL | Status: DC

## 2012-09-17 NOTE — Telephone Encounter (Signed)
Patient called stating that the MD changed her dosage of prilosec 40 mg to 2poqd and she need this new rx called into her pharmacy, [facility] on [address]. Please assist as patient has been trying to get this for a week.

## 2012-09-17 NOTE — Telephone Encounter (Signed)
Please inform patient when this is done.

## 2012-09-17 NOTE — Telephone Encounter (Signed)
As long as she is feeling okay, I don't think there is any need to see a GI doctor

## 2012-09-17 NOTE — Telephone Encounter (Signed)
I spoke with pt and she is feeling better.

## 2012-09-17 NOTE — Telephone Encounter (Signed)
I sent script e-scribe for Prilosec 40 mg 1 po qd and spoke with pt. She wants to know if she needs to see a specialist ( Dr. Ewing Schlein ) about her stomach? She does not need a referral for this.

## 2012-09-25 ENCOUNTER — Other Ambulatory Visit: Payer: Self-pay | Admitting: Nurse Practitioner

## 2012-09-25 ENCOUNTER — Other Ambulatory Visit (HOSPITAL_COMMUNITY)
Admission: RE | Admit: 2012-09-25 | Discharge: 2012-09-25 | Disposition: A | Discharge status: Home / Self Care | Payer: 59 | Source: Ambulatory Visit | Attending: Obstetrics and Gynecology | Admitting: Obstetrics and Gynecology

## 2012-09-25 DIAGNOSIS — Z1151 Encounter for screening for human papillomavirus (HPV): Secondary | ICD-10-CM | POA: Insufficient documentation

## 2012-09-25 DIAGNOSIS — Z01419 Encounter for gynecological examination (general) (routine) without abnormal findings: Secondary | ICD-10-CM | POA: Insufficient documentation

## 2012-11-21 ENCOUNTER — Telehealth: Payer: Self-pay | Admitting: Family Medicine

## 2012-11-21 NOTE — Telephone Encounter (Signed)
Patient Information:  Caller Name: Milina  Phone: [phone]  Patient: Rachel Gallagher, Rachel Gallagher  Gender: Female  DOB: 1981/01/07  Age: 32 Years  PCP: Gershon Crane [facility])  Pregnant: No  Office Follow Up:  Does the office need to follow up with this patient?: No  Instructions For The Office: N/A  RN Note:  Pt refused appt for today due to her work schedule.  Pt is requesting appt for next Friday 11/28/12. Pt transfered to office for the appt; appt made for 11/28/12 at 8:45am Dr Clent Ridges; will comply  Symptoms  Reason For Call & Symptoms: Pt is calling and states that she is taking Omeprazole and tums for heartburn and abdominal pain; sx started 11/17/12; the sx come and go; denies any abdominal pain at present time; did have heartburn today but bettr after takingTums today  Reviewed Health History In EMR: Yes  Reviewed Medications In EMR: Yes  Reviewed Allergies In EMR: Yes  Reviewed Surgeries / Procedures: Yes  Date of Onset of Symptoms: 11/17/2012  Treatments Tried: tums and Omeprazole  Treatments Tried Worked: No OB / GYN:  LMP: 11/16/2012  Guideline(s) Used:  Abdominal Pain - Female  Disposition Per Guideline:   See Today in Office  Reason For Disposition Reached:   Moderate or mild pain that comes and goes (cramps) lasts > 24 hours  Advice Given:  Fluids:  Sip clear fluids only (e.g., water, flat soft drinks or 1/2 strength fruit juice) until the pain has been gone for over 2 hours. Then slowly return to a regular diet.  Call Back If:  Abdominal pain is constant and present for more than 2 hours  Abdominal pains come and go and are present for more than 24 hours  You become worse.  Patient Refused Recommendation:  Patient Will Make Own Appointment  Pt requesting appt for 11/28/12

## 2012-11-28 ENCOUNTER — Encounter: Payer: Self-pay | Admitting: Family Medicine

## 2012-11-28 ENCOUNTER — Ambulatory Visit (INDEPENDENT_AMBULATORY_CARE_PROVIDER_SITE_OTHER): Payer: 59 | Admitting: Family Medicine

## 2012-11-28 VITALS — BP 116/68 | HR 92 | Temp 98.8°F | Wt 147.0 lb

## 2012-11-28 DIAGNOSIS — R109 Unspecified abdominal pain: Secondary | ICD-10-CM

## 2012-11-28 DIAGNOSIS — N760 Acute vaginitis: Secondary | ICD-10-CM

## 2012-11-28 DIAGNOSIS — A499 Bacterial infection, unspecified: Secondary | ICD-10-CM

## 2012-11-28 LAB — POCT URINALYSIS DIPSTICK
Glucose, UA: NEGATIVE
Nitrite, UA: NEGATIVE
Urobilinogen, UA: 0.2

## 2012-11-28 MED ORDER — METRONIDAZOLE 500 MG PO TABS: 500.0000 mg | ORAL_TABLET | Freq: Three times a day (TID) | ORAL | Status: DC

## 2012-11-28 NOTE — Addendum Note (Signed)
Addended by: Aniceto Boss A on: 11/28/2012 09:39 AM   Modules accepted: Orders

## 2012-11-28 NOTE — Progress Notes (Signed)
  Subjective:    Patient ID: Rachel Gallagher, female    DOB: 1981-02-07, 32 y.o.   MRN: [id number]  HPI Here for 4 days of mild lower abdominal cramps. She has some clear vaginal DC. No itching or odor. Her LMP was last week. She had a GYN exam in April and was found to have a "bacterial infection". She was given an antibiotic but she does not remember the name of it. She admits to only taking a few days of this. She has no urinary or bowel symptoms, no fever.    Review of Systems  Constitutional: Negative.   Gastrointestinal: Positive for abdominal pain. Negative for nausea, vomiting, diarrhea, constipation, blood in stool and abdominal distention.  Genitourinary: Negative.        Objective:   Physical Exam  Constitutional: She appears well-developed and well-nourished.  Abdominal: Soft. Bowel sounds are normal. She exhibits no distension and no mass. There is no rebound and no guarding.  Mildly tender above the pubis           Assessment & Plan:  This is probably recurrent bacterial vaginitis. Given 7 days of Metronidazole. Culture the urine.

## 2012-11-30 LAB — URINE CULTURE: Colony Count: 40000

## 2012-12-03 NOTE — Progress Notes (Signed)
Quick Note:  I spoke with pt ______ 

## 2013-01-20 ENCOUNTER — Telehealth: Payer: Self-pay | Admitting: Family Medicine

## 2013-01-20 NOTE — Telephone Encounter (Signed)
FYI for future reference.

## 2013-01-20 NOTE — Telephone Encounter (Signed)
Patient Information:  Caller Name: Marily  Phone: [phone]  Patient: Rachel Gallagher, Rachel Gallagher  Gender: Female  DOB: May 03, 1981  Age: 32 Years  PCP: G[facility] Crane [facility])  Pregnant: No  Office Follow Up:  Does the office need to follow up with this patient?: No  Instructions For The Office: N/A   Symptoms  Reason For Call & Symptoms: Takes med for acid reflux-Omeprazole and TUMS and still having problems with nausea and frequent acidic taste in mouth and back of throat. It causes her to not feel well and supervisor suggested that she see about getting FMLA paperwork filled out in case she needs to stay home d/t reflux/nausea. She would like to speak with Dr. Clent Ridges about it to see if med can be changed.   Reviewed Health History In EMR: Yes  Reviewed Medications In EMR: Yes  Reviewed Allergies In EMR: Yes  Reviewed Surgeries / Procedures: Yes  Date of Onset of Symptoms: 01/20/2013 OB / GYN:  LMP: 01/09/2013  Guideline(s) Used:  Chest Pain  Disposition Per Guideline:   See Today in Office  Reason For Disposition Reached:   Intermittent chest pains persist > 3 days  Advice Given:  Fleeting Chest Pain:  Fleeting chest pains that last only a few seconds and then go away are generally not serious. They may be from pinched muscles or nerves in your chest wall.  Call Back If:  Severe chest pain  Constant chest pain lasting longer than 5 minutes  Difficulty breathing  Fever  You become worse.  Patient Refused Recommendation:  Patient Will Follow Up With Office Later  She is going to get FMLA paperwork and call back to see if morning appointment can be scheduled next week.

## 2013-05-04 ENCOUNTER — Other Ambulatory Visit: Payer: Self-pay | Admitting: Family Medicine

## 2013-07-06 ENCOUNTER — Telehealth: Payer: Self-pay | Admitting: Family Medicine

## 2013-07-06 NOTE — Telephone Encounter (Signed)
Call in Transderm Scopolamine patches to wear behind the ear q 3 days, #5 with a rf

## 2013-07-06 NOTE — Telephone Encounter (Signed)
Pt is going on 4 day cruise and requesting seasickness patches call into [facility] [address]

## 2013-07-07 MED ORDER — SCOPOLAMINE 1 MG/3DAYS TD PT72: 1.0000 | MEDICATED_PATCH | TRANSDERMAL | Status: DC

## 2013-07-07 NOTE — Telephone Encounter (Signed)
I sent script e-scribe and left a voice message for pt. 

## 2014-04-12 ENCOUNTER — Encounter: Payer: Self-pay | Admitting: Family Medicine

## 2014-07-12 ENCOUNTER — Ambulatory Visit (INDEPENDENT_AMBULATORY_CARE_PROVIDER_SITE_OTHER): Payer: 59 | Admitting: Family Medicine

## 2014-07-12 ENCOUNTER — Encounter: Payer: Self-pay | Admitting: Family Medicine

## 2014-07-12 VITALS — BP 115/76 | HR 72 | Temp 99.3°F | Ht 66.0 in | Wt 166.0 lb

## 2014-07-12 DIAGNOSIS — J0100 Acute maxillary sinusitis, unspecified: Secondary | ICD-10-CM

## 2014-07-12 MED ORDER — OMEPRAZOLE 40 MG PO CPDR: DELAYED_RELEASE_CAPSULE | ORAL | Status: DC

## 2014-07-12 MED ORDER — FLUCONAZOLE 150 MG PO TABS: 150.0000 mg | ORAL_TABLET | Freq: Once | ORAL | Status: DC

## 2014-07-12 MED ORDER — CLARITHROMYCIN 500 MG PO TABS: 500.0000 mg | ORAL_TABLET | Freq: Two times a day (BID) | ORAL | Status: DC

## 2014-07-12 NOTE — Progress Notes (Signed)
   Subjective:    Patient ID: Rachel Gallagher, female    DOB: 05/04/1981, 34 y.o.   MRN: [id number]  HPI Here for 2 weeks of sinus pressure, PND, HA, ST, and a dry cough. No fever. She went to [facility] a week ago and was diagnosed with a sinus infection. She was given 7 days of Augmentin and Benzonatate. She felt better for a few days bit now all the same sx are back. She did not like taking the benzonatate because it made her sleepy.  Review of Systems  Constitutional: Negative.   HENT: Positive for congestion, postnasal drip and sinus pressure.   Eyes: Negative.   Respiratory: Positive for cough.        Objective:   Physical Exam  Constitutional: She appears well-developed and well-nourished.  HENT:  Right Ear: External ear normal.  Left Ear: External ear normal.  Nose: Nose normal.  Mouth/Throat: Oropharynx is clear and moist.  Eyes: Conjunctivae are normal.  Pulmonary/Chest: Effort normal and breath sounds normal.  Lymphadenopathy:    She has no cervical adenopathy.          Assessment & Plan:  Partially treated sinusitis. Given Biaxin. Add Delsym for cough.

## 2014-07-12 NOTE — Progress Notes (Signed)
Pre visit review using our clinic review tool, if applicable. No additional management support is needed unless otherwise documented below in the visit note. 

## 2014-07-29 ENCOUNTER — Telehealth: Payer: Self-pay | Admitting: Family Medicine

## 2014-07-29 NOTE — Telephone Encounter (Signed)
[facility] told pt to call her doctor about authorization on omeprazole (PRILOSEC) 40 MG capsule pls advise

## 2014-07-30 NOTE — Telephone Encounter (Signed)
PA was approved. [id number]

## 2014-07-30 NOTE — Telephone Encounter (Signed)
Pharmacy states the PA was still rejecting when I called them and said they will try again later with the authorization number provided.

## 2014-08-23 ENCOUNTER — Telehealth: Payer: Self-pay | Admitting: Family Medicine

## 2014-08-23 NOTE — Telephone Encounter (Signed)
Pt is still having indigestion even after being on omeprazole (PRILOSEC) 40 MG capsule.  Pt is on week 3 of starting this med. Pt wants to know if she should increase to 2/ day or try something else [facility]/ [address]

## 2014-08-24 NOTE — Telephone Encounter (Signed)
Increase the Prilosec to 40 mg BID for a week and see if this helps

## 2014-08-25 NOTE — Telephone Encounter (Signed)
I spoke with pt and went over below information. 

## 2015-07-12 ENCOUNTER — Other Ambulatory Visit: Payer: Self-pay | Admitting: Family Medicine

## 2015-09-07 ENCOUNTER — Encounter: Payer: Self-pay | Admitting: Family Medicine

## 2015-09-07 ENCOUNTER — Ambulatory Visit (INDEPENDENT_AMBULATORY_CARE_PROVIDER_SITE_OTHER): Payer: 59 | Admitting: Family Medicine

## 2015-09-07 VITALS — BP 116/86 | HR 81 | Temp 99.3°F | Ht 66.0 in | Wt 168.0 lb

## 2015-09-07 DIAGNOSIS — J019 Acute sinusitis, unspecified: Secondary | ICD-10-CM

## 2015-09-07 MED ORDER — CLARITHROMYCIN 500 MG PO TABS: 500.0000 mg | ORAL_TABLET | Freq: Two times a day (BID) | ORAL | Status: DC

## 2015-09-07 MED ORDER — FLUCONAZOLE 150 MG PO TABS: 150.0000 mg | ORAL_TABLET | Freq: Once | ORAL | Status: DC

## 2015-09-07 NOTE — Progress Notes (Signed)
   Subjective:    Patient ID: Rachel Gallagher, female    DOB: 10/22/1980, 35 y.o.   MRN: [id number]  HPI Here for 5 days of sinus pressure, HA., PND, and a ST. No cough.    Review of Systems  Constitutional: Negative.   HENT: Positive for congestion, postnasal drip, sinus pressure and sore throat.   Eyes: Negative.   Respiratory: Negative.        Objective:   Physical Exam  Constitutional: She appears well-developed and well-nourished.  HENT:  Right Ear: External ear normal.  Left Ear: External ear normal.  Nose: Nose normal.  Mouth/Throat: Oropharynx is clear and moist.  Eyes: Conjunctivae are normal.  Neck: No thyromegaly present.  Pulmonary/Chest: Effort normal and breath sounds normal.  Lymphadenopathy:    She has no cervical adenopathy.          Assessment & Plan:  Sinusitis, treat with Biaxin

## 2015-09-07 NOTE — Progress Notes (Signed)
Pre visit review using our clinic review tool, if applicable. No additional management support is needed unless otherwise documented below in the visit note. 

## 2015-10-05 ENCOUNTER — Other Ambulatory Visit: Payer: Self-pay | Admitting: Family Medicine

## 2015-11-10 ENCOUNTER — Encounter: Payer: 59 | Admitting: Family Medicine

## 2015-12-08 ENCOUNTER — Other Ambulatory Visit: Payer: Self-pay | Admitting: Family Medicine

## 2016-03-12 ENCOUNTER — Telehealth: Payer: Self-pay | Admitting: Family Medicine

## 2016-03-12 NOTE — Telephone Encounter (Signed)
Please see message. °

## 2016-03-12 NOTE — Telephone Encounter (Signed)
Patient Name: Edgar FriskQUINTISHA Wingerter  DOB: 08/10/1980    Initial Comment Caller states she is having vaginal soreness and sensitiveness.   Nurse Assessment  Nurse: Scarlette ArStandifer, RN, Heather Date/Time (Eastern Time): 03/12/2016 9:46:37 AM  Confirm and document reason for call. If symptomatic, describe symptoms. You must click the next button to save text entered. ---Caller states she is having vaginal soreness and sensitiveness. In august she had a yeast infection and treated it with OTC med and it went away, this feels the same as in the beginning of that yeast infection.  Has the patient traveled out of the country within the last 30 days? ---Not Applicable  Does the patient have any new or worsening symptoms? ---Yes  Will a triage be completed? ---Yes  Related visit to physician within the last 2 weeks? ---No  Does the PT have any chronic conditions? (i.e. diabetes, asthma, etc.) ---Yes  List chronic conditions. ---reflux  Is the patient pregnant or possibly pregnant? (Ask all females between the ages of 2112-55) ---No  Is this a behavioral health or substance abuse call? ---No     Guidelines    Guideline Title Affirmed Question Affirmed Notes  Vulvar Symptoms [1] Symptoms of a yeast infection (i.e., itchy, white discharge, not bad smelling) AND [2] feels like prior vaginal yeast infections    Final Disposition User   [facility] Standifer, RN, Research scientist (physical sciences)Heather    Disagree/Comply: Comply

## 2016-03-13 NOTE — Telephone Encounter (Signed)
Call in Diflucan 150 mg tabs, #1 with 5 rf

## 2016-03-14 MED ORDER — FLUCONAZOLE 150 MG PO TABS: 150.0000 mg | ORAL_TABLET | Freq: Once | ORAL | 5 refills | Status: AC

## 2016-03-14 NOTE — Telephone Encounter (Signed)
I sent script e-scribe to [facility] and left a voice message for pt with this information.  

## 2016-08-01 ENCOUNTER — Telehealth: Payer: Self-pay | Admitting: Family Medicine

## 2016-08-01 NOTE — Telephone Encounter (Signed)
Pt is concerned about taking the medication Prilosec and has heard on the TV that this medication could affect the kidneys and wanted to know if this should be a big concern she also is having a problem when she laughs or sneeze she releases a little bit and have to wear a panty liner on a daily bases.

## 2016-08-01 NOTE — Telephone Encounter (Signed)
TELEPHONE ADVICE RECORD [facility]  Patient Name: Edgar FriskQUINTISHA Niznik  DOB: 10/07/1980    Initial Comment Caller states has been taking Prilosec for years, she states when she coughs or laughs some urine comes out   Nurse Assessment  Nurse: Odis LusterBowers, RN, Bjorn Loserhonda Date/Time (Eastern Time): 08/01/2016 3:04:15 PM  Confirm and document reason for call. If symptomatic, describe symptoms. ---Caller states has been taking Prilosec for years, she states when she coughs or laughs some urine comes out on occasion. Not sure if there is a correlation between the two. Reports that she has birthed 3 children. Denies all other urinary symptoms.  Does the patient have any new or worsening symptoms? ---Yes  Will a triage be completed? ---Yes  Related visit to physician within the last 2 weeks? ---No  Does the PT have any chronic conditions? (i.e. diabetes, asthma, etc.) ---Yes  List chronic conditions. ---GERD  Is the patient pregnant or possibly pregnant? (Ask all females between the ages of 3712-55) ---No  Is this a behavioral health or substance abuse call? ---No     Guidelines    Guideline Title Affirmed Question Affirmed Notes  Urinary Symptoms All other urine symptoms    Final Disposition User   See PCP within 2 Blima DessertWeeks Bowers, RN, Bjorn Loserhonda    Comments  Advised per [employer]: Renal side effects are less than 0.01% for Prilosec. Advised on pelvic floor exercises to strengthen muscles which may improve stress incontinence.   Referrals  REFERRED TO PCP OFFICE   Disagree/Comply: Comply

## 2016-08-02 NOTE — Telephone Encounter (Signed)
Spoke to pt to continue to take Prilosec, and that there is no reliable data to show any safety concerns. As for the bladder issue she can make an OV to evaluate it. Pt verbalized understanding and stated she would schedule an appointment as soon as possible.

## 2016-08-02 NOTE — Telephone Encounter (Signed)
See message below, please advise.

## 2016-08-02 NOTE — Telephone Encounter (Signed)
Please see TE dated 07/31/16.

## 2016-08-02 NOTE — Telephone Encounter (Signed)
Tell her that it is perfectly safe to take Prilosec, and that there is no reliable data to show any safety concerns. As for the bladder issue she can make an OV to evaluate it

## 2016-08-28 ENCOUNTER — Other Ambulatory Visit: Payer: Self-pay | Admitting: Obstetrics & Gynecology

## 2016-08-28 DIAGNOSIS — N6012 Diffuse cystic mastopathy of left breast: Secondary | ICD-10-CM

## 2016-08-30 LAB — CYTOLOGY - PAP

## 2016-10-11 ENCOUNTER — Other Ambulatory Visit: Payer: Self-pay | Admitting: Family Medicine

## 2017-03-12 ENCOUNTER — Encounter: Payer: Self-pay | Admitting: Family Medicine

## 2017-03-12 ENCOUNTER — Ambulatory Visit (INDEPENDENT_AMBULATORY_CARE_PROVIDER_SITE_OTHER): Payer: 59 | Admitting: Family Medicine

## 2017-03-12 VITALS — BP 107/83 | HR 71 | Temp 98.6°F | Ht 66.0 in | Wt 163.0 lb

## 2017-03-12 DIAGNOSIS — Z23 Encounter for immunization: Secondary | ICD-10-CM | POA: Diagnosis not present

## 2017-03-12 DIAGNOSIS — Z0000 Encounter for general adult medical examination without abnormal findings: Secondary | ICD-10-CM | POA: Diagnosis not present

## 2017-03-12 LAB — CBC WITH DIFFERENTIAL/PLATELET
Basophils Absolute: 0 10*3/uL (ref 0.0–0.1)
Basophils Relative: 0.2 % (ref 0.0–3.0)
EOS ABS: 0.1 10*3/uL (ref 0.0–0.7)
Eosinophils Relative: 1.6 % (ref 0.0–5.0)
HCT: 38.5 % (ref 36.0–46.0)
HEMOGLOBIN: 12.9 g/dL (ref 12.0–15.0)
LYMPHS ABS: 2.2 10*3/uL (ref 0.7–4.0)
Lymphocytes Relative: 52.6 % — ABNORMAL HIGH (ref 12.0–46.0)
MCHC: 33.4 g/dL (ref 30.0–36.0)
MCV: 93.9 fl (ref 78.0–100.0)
MONO ABS: 0.3 10*3/uL (ref 0.1–1.0)
Monocytes Relative: 6.4 % (ref 3.0–12.0)
NEUTROS PCT: 39.2 % — AB (ref 43.0–77.0)
Neutro Abs: 1.6 10*3/uL (ref 1.4–7.7)
PLATELETS: 217 10*3/uL (ref 150.0–400.0)
RBC: 4.1 Mil/uL (ref 3.87–5.11)
RDW: 13.2 % (ref 11.5–15.5)
WBC: 4.2 10*3/uL (ref 4.0–10.5)

## 2017-03-12 LAB — BASIC METABOLIC PANEL
BUN: 11 mg/dL (ref 6–23)
CALCIUM: 9.7 mg/dL (ref 8.4–10.5)
CO2: 29 meq/L (ref 19–32)
CREATININE: 0.72 mg/dL (ref 0.40–1.20)
Chloride: 106 mEq/L (ref 96–112)
GFR: 116.9 mL/min (ref >60.00)
GLUCOSE: 84 mg/dL (ref 70–99)
Potassium: 3.9 mEq/L (ref 3.5–5.1)
Sodium: 141 mEq/L (ref 135–145)

## 2017-03-12 LAB — POC URINALSYSI DIPSTICK (AUTOMATED)
Bilirubin, UA: NEGATIVE
GLUCOSE UA: NEGATIVE
KETONES UA: NEGATIVE
Leukocytes, UA: NEGATIVE
Nitrite, UA: NEGATIVE
Protein, UA: NEGATIVE
RBC UA: NEGATIVE
SPEC GRAV UA: 1.015 (ref 1.010–1.025)
UROBILINOGEN UA: 0.2 U/dL
pH, UA: 8.5 — AB (ref 5.0–8.0)

## 2017-03-12 LAB — HEPATIC FUNCTION PANEL
ALBUMIN: 4.6 g/dL (ref 3.5–5.2)
ALT: 12 U/L (ref 0–35)
AST: 15 U/L (ref 0–37)
Alkaline Phosphatase: 41 U/L (ref 39–117)
Bilirubin, Direct: 0.1 mg/dL (ref 0.0–0.3)
TOTAL PROTEIN: 7.6 g/dL (ref 6.0–8.3)
Total Bilirubin: 0.5 mg/dL (ref 0.2–1.2)

## 2017-03-12 LAB — LIPID PANEL
CHOLESTEROL: 197 mg/dL (ref 0–200)
HDL: 62.9 mg/dL (ref >39.00)
LDL Cholesterol: 121 mg/dL — ABNORMAL HIGH (ref 0–99)
NonHDL: 133.81
TRIGLYCERIDES: 65 mg/dL (ref 0.0–149.0)
Total CHOL/HDL Ratio: 3
VLDL: 13 mg/dL (ref 0.0–40.0)

## 2017-03-12 LAB — TSH: TSH: 0.76 u[IU]/mL (ref 0.35–4.50)

## 2017-03-12 MED ORDER — OMEPRAZOLE 40 MG PO CPDR: 40.0000 mg | DELAYED_RELEASE_CAPSULE | Freq: Every day | ORAL | 3 refills | Status: DC

## 2017-03-12 MED ORDER — LORATADINE 10 MG PO TABS: 10.0000 mg | ORAL_TABLET | Freq: Every day | ORAL | 11 refills | Status: DC | PRN

## 2017-03-12 NOTE — Progress Notes (Signed)
   Subjective:    Patient ID: Rachel Gallagher, female    DOB: 12-Sep-1980, 36 y.o.   MRN: [id number]  HPI Here for a well exam. She feels well except for some mild pain in the shoulders and lower back. 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 She sometimes takes Tylenol for this. She admits that she does not exercise as much as she used to, and the back pain started when she stopped exercising. Her allergies and GERD are stable.    Review of Systems  Constitutional: Negative.   HENT: Negative.   Eyes: Negative.   Respiratory: Negative.   Cardiovascular: Negative.   Gastrointestinal: Negative.   Genitourinary: Negative for decreased urine volume, difficulty urinating, dyspareunia, dysuria, enuresis, flank pain, frequency, hematuria, pelvic pain and urgency.  Musculoskeletal: Positive for back pain. Negative for arthralgias, gait problem, joint swelling, myalgias, neck pain and neck stiffness.  Skin: Negative.   Neurological: Negative.   Psychiatric/Behavioral: Negative.        Objective:   Physical Exam  Constitutional: She is oriented to person, place, and time. She appears well-developed and well-nourished. No distress.  HENT:  Head: Normocephalic and atraumatic.  Right Ear: External ear normal.  Left Ear: External ear normal.  Nose: Nose normal.  Mouth/Throat: Oropharynx is clear and moist. No oropharyngeal exudate.  Eyes: Pupils are equal, round, and reactive to light. Conjunctivae and EOM are normal. No scleral icterus.  Neck: Normal range of motion. Neck supple. No JVD present. No thyromegaly present.  Cardiovascular: Normal rate, regular rhythm, normal heart sounds and intact distal pulses.  Exam reveals no gallop and no friction rub.   No murmur heard. Pulmonary/Chest: Effort normal and breath sounds normal. No respiratory distress. She has no wheezes. She has no rales. She exhibits no tenderness.  Abdominal: Soft. Bowel sounds are  normal. She exhibits no distension and no mass. There is no tenderness. There is no rebound and no guarding.  Musculoskeletal: Normal range of motion. She exhibits no edema or tenderness.  Lymphadenopathy:    She has no cervical adenopathy.  Neurological: She is alert and oriented to person, place, and time. She has normal reflexes. No cranial nerve deficit. She exhibits normal muscle tone. Coordination normal.  Skin: Skin is warm and dry. No rash noted. No erythema.  Psychiatric: She has a normal mood and affect. Her behavior is normal. Judgment and thought content normal.          Assessment & Plan:  Well exam. We discussed diet and exercise. I think regular exercise would help her back pain. I also recommended she get up and move around at her job as much as possible. Get fasting labs today. Gershon Crane, MD

## 2017-03-12 NOTE — Patient Instructions (Signed)
WE NOW OFFER   [facility] FAST TRACK!!!  SAME DAY Appointments for ACUTE CARE  Such as: Sprains, Injuries, cuts, abrasions, rashes, muscle pain, joint pain, back pain Colds, flu, sore throats, headache, allergies, cough, fever  Ear pain, sinus and eye infections Abdominal pain, nausea, vomiting, diarrhea, upset stomach Animal/insect bites  3 Easy Ways to Schedule: Walk-In Scheduling Call in scheduling Mychart Sign-up: [email or web address]         

## 2017-06-20 ENCOUNTER — Ambulatory Visit (INDEPENDENT_AMBULATORY_CARE_PROVIDER_SITE_OTHER): Payer: 59 | Admitting: Internal Medicine

## 2017-06-20 ENCOUNTER — Encounter: Payer: Self-pay | Admitting: Internal Medicine

## 2017-06-20 VITALS — BP 110/70 | HR 81 | Temp 99.4°F | Ht 66.0 in | Wt 164.8 lb

## 2017-06-20 DIAGNOSIS — H8309 Labyrinthitis, unspecified ear: Secondary | ICD-10-CM

## 2017-06-20 MED ORDER — MECLIZINE HCL 25 MG PO TABS: 25.0000 mg | ORAL_TABLET | Freq: Three times a day (TID) | ORAL | 0 refills | Status: DC | PRN

## 2017-06-20 NOTE — Patient Instructions (Signed)
Claritin 1 tablet daily  Call or return to clinic prn if these symptoms worsen or fail to improve as anticipated.

## 2017-06-20 NOTE — Progress Notes (Signed)
Subjective:    Patient ID: Rachel Gallagher, female    DOB: 10-12-1980, 37 y.o.   MRN: [id number]  HPI  37 year old patient who generally enjoys excellent health.  3 days ago she awoke with nausea which was quite severe associated with some dizziness and a sense of disequilibrium.  She denies any true vertigo symptoms are worse in the morning and improves throughout the day symptoms have steadily improved each day.  She has not taken any medications.  Past Medical History:  Diagnosis Date  . Dysrhythmia    irregular as a child  . GERD (gastroesophageal reflux disease)   . Hiatal hernia   . Infection    UTI 7/12 end of preganacy  . Routine gynecological examination    sees Dr. Gerald Leitz      Social History   Socioeconomic History  . Marital status: Married    Spouse name: Not on file  . Number of children: Not on file  . Years of education: Not on file  . Highest education level: Not on file  Social Needs  . Financial resource strain: Not on file  . Food insecurity - worry: Not on file  . Food insecurity - inability: Not on file  . Transportation needs - medical: Not on file  . Transportation needs - non-medical: Not on file  Occupational History  . Not on file  Tobacco Use  . Smoking status: Never Smoker  . Smokeless tobacco: Never Used  Substance and Sexual Activity  . Alcohol use: Yes    Alcohol/week: 1.8 oz    Types: 3 Standard drinks or equivalent per week    Comment: occ  . Drug use: No  . Sexual activity: Yes  Other Topics Concern  . Not on file  Social History Narrative  . Not on file    Past Surgical History:  Procedure Laterality Date  . HERNIA REPAIR  10/08/11   umbilical hernia repair  . TUBAL LIGATION  12/24/2010   Procedure: POST PARTUM TUBAL LIGATION;  Surgeon: [name];  Location: [facility];  Service: Gynecology;  Laterality: Bilateral;  . UMBILICAL HERNIA REPAIR  10/08/2011   Procedure: HERNIA REPAIR UMBILICAL ADULT;  Surgeon: Emelia Loron, MD;  Location: [facility];  Service: General;  Laterality: N/A;  . WISDOM TOOTH EXTRACTION      Family History  Problem Relation Age of Onset  . Heart disease Maternal Grandmother     Allergies  Allergen Reactions  . Contrast Media [Iodinated Diagnostic Agents]     Current Outpatient Medications on File Prior to Visit  Medication Sig Dispense Refill  . omeprazole (PRILOSEC) 40 MG capsule Take 1 capsule (40 mg total) by mouth daily. 90 capsule 3  . [DISCONTINUED] DiphenhydrAMINE HCl (BENADRYL ALLERGY PO) Take by mouth.    . [DISCONTINUED] famotidine (PEPCID AC) 10 MG chewable tablet Chew 10 mg by mouth 2 (two) times daily as needed. Heart burn      No current facility-administered medications on file prior to visit.     BP 110/70 (BP Location: Left Arm, Patient Position: Sitting, Cuff Size: Normal)   Pulse 81   Temp 99.4 F (37.4 C) (Oral)   Ht 5\' 6"  (1.676 m)   Wt 164 lb 12.8 oz (74.8 kg)   SpO2 100%   BMI 26.60 kg/m     Review of Systems  Constitutional: Positive for activity change and appetite change.  HENT: Negative for congestion, dental problem, hearing loss, rhinorrhea, sinus pressure, sore throat  and tinnitus.   Eyes: Negative for pain, discharge and visual disturbance.  Respiratory: Negative for cough and shortness of breath.   Cardiovascular: Negative for chest pain, palpitations and leg swelling.  Gastrointestinal: Positive for nausea. Negative for abdominal distention, abdominal pain, blood in stool, constipation, diarrhea and vomiting.  Genitourinary: Negative for difficulty urinating, dysuria, flank pain, frequency, hematuria, pelvic pain, urgency, vaginal bleeding, vaginal discharge and vaginal pain.  Musculoskeletal: Negative for arthralgias, gait problem and joint swelling.  Skin: Negative for rash.  Neurological: Positive for dizziness and light-headedness. Negative for syncope, speech difficulty, weakness, numbness and headaches.    Hematological: Negative for adenopathy.  Psychiatric/Behavioral: Negative for agitation, behavioral problems and dysphoric mood. The patient is not nervous/anxious.        Objective:   Physical Exam  Constitutional: She is oriented to person, place, and time. She appears well-developed and well-nourished. No distress.  Normal blood pressure Temperature 99.4  HENT:  Head: Normocephalic.  Right Ear: External ear normal.  Left Ear: External ear normal.  Mouth/Throat: Oropharynx is clear and moist.  Both tympanic membranes were normal  Eyes: Conjunctivae and EOM are normal. Pupils are equal, round, and reactive to light.  Neck: Normal range of motion. Neck supple. No thyromegaly present.  Cardiovascular: Normal rate, regular rhythm, normal heart sounds and intact distal pulses.  Pulmonary/Chest: Effort normal and breath sounds normal.  Abdominal: Soft. Bowel sounds are normal. She exhibits no mass. There is no tenderness.  Musculoskeletal: Normal range of motion.  Lymphadenopathy:    She has no cervical adenopathy.  Neurological: She is alert and oriented to person, place, and time. No cranial nerve deficit.  Normal gait Romberg normal Normal finger to nose testing  No cranial nerve deficits  Skin: Skin is warm and dry. No rash noted.  Psychiatric: She has a normal mood and affect. Her behavior is normal.          Assessment & Plan:   Low-grade fever in the setting of nausea and sense of mild disequilibrium.  Probable viral labyrinthitis.  It was recommended the patient resume Claritin once daily.  We will also place on meclizine short-term.  Patient report any new or worsening symptoms  Rogelia BogaKWIATKOWSKI,Baraa Tubbs FRANK

## 2017-09-26 ENCOUNTER — Telehealth: Payer: Self-pay | Admitting: Family Medicine

## 2017-09-26 MED ORDER — FLUCONAZOLE 150 MG PO TABS: 150.0000 mg | ORAL_TABLET | Freq: Once | ORAL | 11 refills | Status: AC

## 2017-09-26 NOTE — Telephone Encounter (Signed)
Patient is requesting a refill of Diflucan. Patient states she has the beginning symptoms of yeast infection. Patient states she has sensitivity, slight itching and little discharge. Discharge is clear at this time. Patient has not used an OTC treatment- she states it normally does not work. Told patient since she was treated over the phone in October with refills- she may need to be seen- will put her request.   RX request: Diflucan  LOV: 06/20/17 ( acute- seen by another provider)  PCP: Clent RidgesFry  [facility] [place]

## 2017-09-26 NOTE — Telephone Encounter (Signed)
Done

## 2017-09-26 NOTE — Telephone Encounter (Signed)
Sent to PCP to advise 

## 2017-09-26 NOTE — Telephone Encounter (Signed)
Copied from CRM [phone]. Topic: Quick Communication - Rx Refill/Question >> Sep 26, 2017  9:33 AM Rudi CocoLathan, Neasia Fleeman M, NT wrote: Medication: fluconazole (DIFLUCAN) 150 MG tablet [[id number]]  Has the patient contacted their pharmacy? yes (Agent: If no, request that the patient contact the pharmacy for the refill.) Preferred Pharmacy (with phone number or street name): [id number] [place] [place] - [address] [place] [id number] Phone: [phone] Fax: [phone]   Agent: Please be advised that RX refills may take up to 3 business days. We ask that you follow-up with your pharmacy.

## 2018-04-01 ENCOUNTER — Ambulatory Visit: Payer: 59 | Admitting: Adult Health

## 2018-04-01 ENCOUNTER — Encounter: Payer: Self-pay | Admitting: *Deleted

## 2018-04-01 ENCOUNTER — Encounter: Payer: Self-pay | Admitting: Family Medicine

## 2018-04-01 ENCOUNTER — Ambulatory Visit (INDEPENDENT_AMBULATORY_CARE_PROVIDER_SITE_OTHER): Payer: 59 | Admitting: Family Medicine

## 2018-04-01 VITALS — BP 120/78 | HR 92 | Temp 99.0°F | Resp 12 | Ht 66.0 in | Wt 170.5 lb

## 2018-04-01 DIAGNOSIS — J069 Acute upper respiratory infection, unspecified: Secondary | ICD-10-CM | POA: Diagnosis not present

## 2018-04-01 DIAGNOSIS — R05 Cough: Secondary | ICD-10-CM

## 2018-04-01 DIAGNOSIS — J040 Acute laryngitis: Secondary | ICD-10-CM

## 2018-04-01 DIAGNOSIS — R059 Cough, unspecified: Secondary | ICD-10-CM

## 2018-04-01 MED ORDER — HYDROCODONE-HOMATROPINE 5-1.5 MG/5ML PO SYRP: 5.0000 mL | ORAL_SOLUTION | Freq: Every evening | ORAL | 0 refills | Status: AC | PRN

## 2018-04-01 MED ORDER — BENZONATATE 100 MG PO CAPS: 200.0000 mg | ORAL_CAPSULE | Freq: Two times a day (BID) | ORAL | 0 refills | Status: AC | PRN

## 2018-04-01 NOTE — Progress Notes (Signed)
ACUTE VISIT  HPI:  Chief Complaint  Patient presents with  . Laryngitis    sx started Saturday  . Cough    w/ green thick mucus    [name] is a 37 y.o.female here today complaining of 3 days of respiratory symptoms. Productive cough with greenish mucus, dysphonia, and postnasal drainage. She has not identified exacerbating or alleviating factors for dysphonia, it is constant and has been a stable.  She is reporting history of "sinus infections." She has had chills but thinks she has had fever, she has not checked her temperature.  She denies odynophagia, dysphagia, stridor. Negative for wheezing or dyspnea.  Hx of GERD.   URI   This is a new problem. The current episode started in the past 7 days. The problem has been unchanged. Associated symptoms include congestion, coughing, ear pain, a plugged ear sensation and rhinorrhea. Pertinent negatives include no abdominal pain, diarrhea, headaches, nausea, neck pain, rash, sinus pain, sneezing, sore throat, swollen glands, vomiting or wheezing. She has tried decongestant for the symptoms. The treatment provided no relief.   No Hx of recent travel. No sick contact. No known insect bite. No history of tobacco use.  Hx of allergies: Yes, seasonal allergies.  OTC medications for this problem: Mucinex.   Review of Systems  Constitutional: Positive for chills, fatigue and fever. Negative for activity change and appetite change.  HENT: Positive for congestion, ear pain, postnasal drip, rhinorrhea and voice change. Negative for mouth sores, sinus pressure, sinus pain, sneezing, sore throat and trouble swallowing.   Eyes: Negative for discharge, redness and itching.  Respiratory: Positive for cough. Negative for shortness of breath and wheezing.   Gastrointestinal: Negative for abdominal pain, diarrhea, nausea and vomiting.  Musculoskeletal: Negative for myalgias and neck pain.  Skin: Negative for rash.    Allergic/Immunologic: Positive for environmental allergies.  Neurological: Negative for weakness and headaches.  Hematological: Negative for adenopathy. Does not bruise/bleed easily.      Current Outpatient Medications on File Prior to Visit  Medication Sig Dispense Refill  . IBUPROFEN PO Take by mouth as needed.    Marland Kitchen omeprazole (PRILOSEC) 40 MG capsule Take 1 capsule (40 mg total) by mouth daily. 90 capsule 3  . [DISCONTINUED] DiphenhydrAMINE HCl (BENADRYL ALLERGY PO) Take by mouth.    . [DISCONTINUED] famotidine (PEPCID AC) 10 MG chewable tablet Chew 10 mg by mouth 2 (two) times daily as needed. Heart burn      No current facility-administered medications on file prior to visit.      Past Medical History:  Diagnosis Date  . Dysrhythmia    irregular as a child  . GERD (gastroesophageal reflux disease)   . Hiatal hernia   . Infection    UTI 7/12 end of preganacy  . Routine gynecological examination    sees Dr. Gerald Leitz    Allergies  Allergen Reactions  . Contrast Media [Iodinated Diagnostic Agents]     Social History   Socioeconomic History  . Marital status: Married    Spouse name: Not on file  . Number of children: Not on file  . Years of education: Not on file  . Highest education level: Not on file  Occupational History  . Not on file  Social Needs  . Financial resource strain: Not on file  . Food insecurity:    Worry: Not on file    Inability: Not on file  . Transportation needs:    Medical:  Not on file    Non-medical: Not on file  Tobacco Use  . Smoking status: Never Smoker  . Smokeless tobacco: Never Used  Substance and Sexual Activity  . Alcohol use: Yes    Alcohol/week: 3.0 standard drinks    Types: 3 Standard drinks or equivalent per week    Comment: occ  . Drug use: No  . Sexual activity: Yes  Lifestyle  . Physical activity:    Days per week: Not on file    Minutes per session: Not on file  . Stress: Not on file  Relationships  .  Social connections:    Talks on phone: Not on file    Gets together: Not on file    Attends religious service: Not on file    Active member of club or organization: Not on file    Attends meetings of clubs or organizations: Not on file    Relationship status: Not on file  Other Topics Concern  . Not on file  Social History Narrative  . Not on file    Vitals:   04/01/18 0833  BP: 120/78  Pulse: 92  Resp: 12  Temp: 99 F (37.2 C)  SpO2: 99%   Body mass index is 27.52 kg/m.   Physical Exam  Nursing note and vitals reviewed. Constitutional: She is oriented to person, place, and time. She appears well-developed and well-nourished. She does not appear ill. No distress.  HENT:  Head: Normocephalic and atraumatic.  Right Ear: Tympanic membrane, external ear and ear canal normal.  Left Ear: Tympanic membrane, external ear and ear canal normal.  Nose: Rhinorrhea present. Right sinus exhibits no maxillary sinus tenderness and no frontal sinus tenderness. Left sinus exhibits no maxillary sinus tenderness and no frontal sinus tenderness.  Mouth/Throat: Oropharynx is clear and moist and mucous membranes are normal.  Hypertrophic turbinates. Moderate dysphonia.  Eyes: Conjunctivae are normal.  Neck: No muscular tenderness present. No edema and no erythema present.  Cardiovascular: Normal rate and regular rhythm.  No murmur heard. Respiratory: Effort normal and breath sounds normal. No stridor. No respiratory distress.  Lymphadenopathy:       Head (right side): No submandibular adenopathy present.       Head (left side): No submandibular adenopathy present.    She has cervical adenopathy (< 1 cm,bilateral,no tender.).       Right cervical: Posterior cervical adenopathy present.       Left cervical: Posterior cervical adenopathy present.  Neurological: She is alert and oriented to person, place, and time. She has normal strength. Gait normal.  Skin: Skin is warm. No rash noted. No  erythema.  Psychiatric: She has a normal mood and affect.  Well groomed, good eye contact.      ASSESSMENT AND PLAN:   Rachel Gallagher was seen today for laryngitis and cough.  Diagnoses and all orders for this visit:  URI, acute Symptoms suggests a viral etiology Instructed to monitor for signs of complications,  clearly instructed about warning signs. Monitor temperature. I also explained that cough and nasal congestion can last a few days and sometimes weeks. F/U as needed.   Acute laryngitis Is located above diagnosis, prognosis, and possible complications. Explained that it is most likely viral, so antibiotic treatment is not recommended at this time. Voice rest is the main treatment for now. For now not recommending oral steroid treatment. She was clearly instructed about warning signs.  Cough  Lung auscultation today is negative. Possible etiologies discolored. I do not  think imaging is needed at this time. We discussed some side effects of Hycodan. Follow-up with PCP as needed.  -     benzonatate (TESSALON) 100 MG capsule; Take 2 capsules (200 mg total) by mouth 2 (two) times daily as needed for up to 10 days. -     HYDROcodone-homatropine (HYCODAN) 5-1.5 MG/5ML syrup; Take 5 mLs by mouth at bedtime as needed for up to 7 days for cough.      Betty G. [place], MD  [facility].

## 2018-04-01 NOTE — Patient Instructions (Signed)
  Rachel Gallagher I have seen you today for an acute visit.  A few things to remember from today's visit:   URI, acute  Acute laryngitis  Cough   If medications prescribed today, they will not be refill upon request, a follow up appointment with PCP will be necessary to discuss continuation of of treatment if appropriate.  Voice rest. viral infections are self-limited and we treat each symptom depending of severity.  Over the counter medications as decongestants and cold medications usually help, they need to be taken with caution if there is a history of high blood pressure or palpitations. Tylenol and/or Ibuprofen also helps with most symptoms (headache, muscle aching, fever,etc) Plenty of fluids. Honey helps with cough. Steam inhalations helps with runny nose, nasal congestion, and may prevent sinus infections. Cough and nasal congestion could last a few days and sometimes weeks. Please follow in not any better in 1-2 weeks or if symptoms get worse.    In general please monitor for signs of worsening symptoms and seek immediate medical attention if any concerning.    I hope you get better soon!

## 2018-05-26 ENCOUNTER — Other Ambulatory Visit: Payer: Self-pay | Admitting: Family Medicine

## 2019-04-29 ENCOUNTER — Ambulatory Visit (INDEPENDENT_AMBULATORY_CARE_PROVIDER_SITE_OTHER): Payer: 59 | Admitting: Family Medicine

## 2019-04-29 ENCOUNTER — Encounter: Payer: Self-pay | Admitting: Family Medicine

## 2019-04-29 ENCOUNTER — Other Ambulatory Visit: Payer: Self-pay

## 2019-04-29 ENCOUNTER — Ambulatory Visit: Payer: 59 | Admitting: Family Medicine

## 2019-04-29 ENCOUNTER — Telehealth: Payer: Self-pay | Admitting: Family Medicine

## 2019-04-29 VITALS — BP 120/86 | HR 75 | Temp 97.3°F | Ht 66.0 in | Wt 178.0 lb

## 2019-04-29 DIAGNOSIS — R300 Dysuria: Secondary | ICD-10-CM | POA: Diagnosis not present

## 2019-04-29 DIAGNOSIS — N390 Urinary tract infection, site not specified: Secondary | ICD-10-CM

## 2019-04-29 DIAGNOSIS — Z23 Encounter for immunization: Secondary | ICD-10-CM | POA: Diagnosis not present

## 2019-04-29 LAB — POCT URINALYSIS DIPSTICK
Bilirubin, UA: NEGATIVE
Glucose, UA: NEGATIVE
Ketones, UA: NEGATIVE
Nitrite, UA: NEGATIVE
Protein, UA: NEGATIVE
Spec Grav, UA: 1.025 (ref 1.010–1.025)
Urobilinogen, UA: 0.2 E.U./dL
pH, UA: 6 (ref 5.0–8.0)

## 2019-04-29 MED ORDER — CIPROFLOXACIN HCL 500 MG PO TABS: 500.0000 mg | ORAL_TABLET | Freq: Two times a day (BID) | ORAL | 0 refills | Status: DC

## 2019-04-29 NOTE — Progress Notes (Signed)
   Subjective:    Patient ID: Rachel Gallagher, female    DOB: [birth year], 38 y.o.   MRN: [id number]  HPI Here for 2 months of intermittent urgency to urinate and mild low back pain. No burning or fever. She drinks plenty of water and she has used Azo at times. Her LMP was 2 weeks ago. No vaginal DC.    Review of Systems  Constitutional: Negative.   Respiratory: Negative.   Cardiovascular: Negative.   Gastrointestinal: Negative.   Genitourinary: Positive for frequency and urgency. Negative for dysuria, flank pain, hematuria and pelvic pain.       Objective:   Physical Exam Constitutional:      Appearance: Normal appearance. She is not ill-appearing.  Cardiovascular:     Rate and Rhythm: Normal rate and regular rhythm.     Pulses: Normal pulses.     Heart sounds: Normal heart sounds.  Pulmonary:     Effort: Pulmonary effort is normal.     Breath sounds: Normal breath sounds.  Abdominal:     General: Abdomen is flat. Bowel sounds are normal. There is no distension.     Palpations: Abdomen is soft. There is no mass.     Tenderness: There is no guarding or rebound.     Hernia: No hernia is present.     Comments: Mildly tender above the pubis   Neurological:     Mental Status: She is alert.           Assessment & Plan:  UTI, treat with Cipro. Await the culture results.  Alysia Penna, MD

## 2019-04-29 NOTE — Telephone Encounter (Signed)
Pt states that Dr. Sarajane Jews put her on an antibiotic today and she forgot to tell him that antibiotics usually cause yeast infections and she wants to know if she can have something called in for that. Pt uses     [id number] - [place], [place] [phone] (Phone) [phone] (Fax)

## 2019-04-29 NOTE — Telephone Encounter (Signed)
Ok for fill?

## 2019-04-29 NOTE — Telephone Encounter (Signed)
Call in Diflucan 150 mg to take as needed, #1 with 5 rf  °

## 2019-05-01 LAB — URINE CULTURE
MICRO NUMBER:: 1114729
SPECIMEN QUALITY:: ADEQUATE

## 2019-05-01 NOTE — Telephone Encounter (Signed)
Pt stated she contacted her pharmacy and the Rx has not been received. Pt requests that the Rx be sent to her pharmacy.

## 2019-05-05 ENCOUNTER — Other Ambulatory Visit: Payer: Self-pay

## 2019-05-05 MED ORDER — FLUCONAZOLE 150 MG PO TABS: 150.0000 mg | ORAL_TABLET | Freq: Once | ORAL | 5 refills | Status: AC

## 2019-05-05 NOTE — Telephone Encounter (Signed)
Rx sent to pharmacy. Not sure what the delay was. Will call pt in th a.m.

## 2019-05-15 NOTE — Telephone Encounter (Signed)
Spoke to pt and she stated everything was fine. No further action needed.

## 2019-07-10 ENCOUNTER — Other Ambulatory Visit: Payer: Self-pay | Admitting: Family Medicine

## 2019-08-11 ENCOUNTER — Telehealth: Payer: Self-pay | Admitting: Family Medicine

## 2019-08-11 MED ORDER — OMEPRAZOLE 40 MG PO CPDR: DELAYED_RELEASE_CAPSULE | ORAL | 0 refills | Status: DC

## 2019-08-11 NOTE — Telephone Encounter (Signed)
Pt is requesting a refill on Omeprazole 40 mg capsule sent to [facility] in [place]). Thanks

## 2019-08-11 NOTE — Telephone Encounter (Signed)
Rx sent in. Patient is aware.  

## 2019-09-19 ENCOUNTER — Ambulatory Visit: Payer: 59 | Attending: Internal Medicine

## 2019-09-19 DIAGNOSIS — Z23 Encounter for immunization: Secondary | ICD-10-CM

## 2019-09-19 NOTE — Progress Notes (Signed)
   Covid-19 Vaccination Clinic  Name:  Rachel Gallagher    MRN: [id number] DOB: [birth year]  09/19/2019  [name] was observed post Covid-19 immunization for 15 minutes without incident. She was provided with Vaccine Information Sheet and instruction to access the V-Safe system.   [name] was instructed to call 911 with any severe reactions post vaccine: Marland Kitchen Difficulty breathing  . Swelling of face and throat  . A fast heartbeat  . A bad rash all over body  . Dizziness and weakness   Immunizations Administered    Name Date Dose VIS Date Route   Pfizer COVID-19 Vaccine 09/19/2019  3:14 PM 0.3 mL 05/22/2019 Intramuscular   Manufacturer: [employer], [employer]   Lot: [id number]   NDC: [address]-8

## 2019-10-12 ENCOUNTER — Ambulatory Visit: Payer: 59 | Attending: Internal Medicine

## 2019-10-12 DIAGNOSIS — Z23 Encounter for immunization: Secondary | ICD-10-CM

## 2019-10-12 NOTE — Progress Notes (Signed)
   Covid-19 Vaccination Clinic  Name:  Rachel Gallagher    MRN: [id number] DOB: [birth year]  10/12/2019  [name] was observed post Covid-19 immunization for 15 minutes without incident. She was provided with Vaccine Information Sheet and instruction to access the V-Safe system.   [name] was instructed to call 911 with any severe reactions post vaccine: Marland Kitchen Difficulty breathing  . Swelling of face and throat  . A fast heartbeat  . A bad rash all over body  . Dizziness and weakness   Immunizations Administered    Name Date Dose VIS Date Route   Pfizer COVID-19 Vaccine 10/12/2019  3:33 PM 0.3 mL 08/05/2018 Intramuscular   Manufacturer: [employer], [employer]   Lot: [id number]   NDC: [address]-7

## 2019-12-04 ENCOUNTER — Other Ambulatory Visit: Payer: Self-pay | Admitting: Obstetrics and Gynecology

## 2019-12-04 DIAGNOSIS — R928 Other abnormal and inconclusive findings on diagnostic imaging of breast: Secondary | ICD-10-CM

## 2019-12-18 ENCOUNTER — Other Ambulatory Visit: Payer: Self-pay

## 2019-12-18 ENCOUNTER — Ambulatory Visit
Admission: RE | Admit: 2019-12-18 | Discharge: 2019-12-18 | Disposition: A | Discharge status: Home / Self Care | Payer: 59 | Source: Ambulatory Visit | Attending: Obstetrics and Gynecology | Admitting: Obstetrics and Gynecology

## 2019-12-18 ENCOUNTER — Other Ambulatory Visit: Payer: Self-pay | Admitting: Obstetrics and Gynecology

## 2019-12-18 DIAGNOSIS — R928 Other abnormal and inconclusive findings on diagnostic imaging of breast: Secondary | ICD-10-CM

## 2020-06-22 ENCOUNTER — Other Ambulatory Visit: Payer: 59

## 2020-06-24 ENCOUNTER — Other Ambulatory Visit: Payer: 59

## 2020-08-12 ENCOUNTER — Telehealth: Payer: Self-pay | Admitting: Family Medicine

## 2020-08-12 NOTE — Telephone Encounter (Signed)
Last office visit 04/29/2019.

## 2020-08-12 NOTE — Telephone Encounter (Signed)
Set up an OV, we have not seen her in well over a year

## 2020-08-12 NOTE — Telephone Encounter (Signed)
Pt is calling in stating that she is very constipated and having taking some probiotics. And to see if she can get something for weight gain she stated that she has been working out and curving her eating and still have not seen any weight loss.

## 2020-08-12 NOTE — Telephone Encounter (Signed)
Pt is scheduled for an office visit on 08/16/2020 at 3 pm

## 2020-08-15 ENCOUNTER — Other Ambulatory Visit: Payer: Self-pay

## 2020-08-16 ENCOUNTER — Ambulatory Visit (INDEPENDENT_AMBULATORY_CARE_PROVIDER_SITE_OTHER): Payer: 59 | Admitting: Family Medicine

## 2020-08-16 ENCOUNTER — Encounter: Payer: Self-pay | Admitting: Family Medicine

## 2020-08-16 VITALS — BP 120/80 | HR 74 | Temp 99.2°F | Wt 177.0 lb

## 2020-08-16 DIAGNOSIS — K5900 Constipation, unspecified: Secondary | ICD-10-CM | POA: Diagnosis not present

## 2020-08-16 NOTE — Progress Notes (Unsigned)
   Subjective:    Patient ID: Rachel Gallagher, female    DOB: December 05, 1980, 40 y.o.   MRN: [id number]  HPI Here for 6 months of constipation. She has no discomfort, but sometimes she will go a week without a BM. She drinks plenty of water and tried to get fiber in her diet. She started using a probiotic OTC one week ago, and there has been some improvement.    Review of Systems  Constitutional: Negative.   Respiratory: Negative.   Cardiovascular: Negative.   Gastrointestinal: Positive for constipation. Negative for abdominal distention, abdominal pain, anal bleeding, blood in stool, diarrhea, nausea, rectal pain and vomiting.       Objective:   Physical Exam Constitutional:      Appearance: Normal appearance.  Cardiovascular:     Rate and Rhythm: Normal rate and regular rhythm.     Pulses: Normal pulses.     Heart sounds: Normal heart sounds.  Pulmonary:     Effort: Pulmonary effort is normal.     Breath sounds: Normal breath sounds.  Abdominal:     General: Abdomen is flat. Bowel sounds are normal. There is no distension.     Palpations: Abdomen is soft. There is no mass.     Tenderness: There is no abdominal tenderness. There is no guarding or rebound.     Hernia: No hernia is present.  Neurological:     Mental Status: She is alert.           Assessment & Plan:  Constipation. She will continue the probiotic, but I also suggested she try Miralax every day. Recheck as needed.  Gershon Crane, MD

## 2020-08-19 ENCOUNTER — Ambulatory Visit
Admission: RE | Admit: 2020-08-19 | Discharge: 2020-08-19 | Disposition: A | Discharge status: Home / Self Care | Payer: 59 | Source: Ambulatory Visit | Attending: Obstetrics and Gynecology | Admitting: Obstetrics and Gynecology

## 2020-08-19 ENCOUNTER — Other Ambulatory Visit: Payer: Self-pay | Admitting: Obstetrics and Gynecology

## 2020-08-19 ENCOUNTER — Other Ambulatory Visit: Payer: Self-pay

## 2020-08-19 DIAGNOSIS — R928 Other abnormal and inconclusive findings on diagnostic imaging of breast: Secondary | ICD-10-CM

## 2021-02-20 ENCOUNTER — Telehealth (INDEPENDENT_AMBULATORY_CARE_PROVIDER_SITE_OTHER): Payer: 59 | Admitting: Family Medicine

## 2021-02-20 ENCOUNTER — Encounter: Payer: Self-pay | Admitting: Family Medicine

## 2021-02-20 VITALS — Wt 177.0 lb

## 2021-02-20 DIAGNOSIS — U071 COVID-19: Secondary | ICD-10-CM | POA: Insufficient documentation

## 2021-02-20 MED ORDER — MOLNUPIRAVIR EUA 200MG CAPSULE
4.0000 | ORAL_CAPSULE | Freq: Two times a day (BID) | ORAL | 0 refills | Status: AC
Start: 2021-02-20 — End: 2021-02-25

## 2021-02-20 NOTE — Progress Notes (Signed)
Subjective:    Patient ID: Rachel Gallagher, female    DOB: 10/01/1980, 40 y.o.   MRN: [id number]  HPI Virtual Visit via Video Note  I connected with the patient on 02/20/21 at  2:15 PM EDT by a video enabled telemedicine application and verified that I am speaking with the correct person using two identifiers.  Location patient: home Location provider:work or home office Persons participating in the virtual visit: patient, provider  I discussed the limitations of evaluation and management by telemedicine and the availability of in person appointments. The patient expressed understanding and agreed to proceed.   HPI: Here fora Covid-19 infection. She came back home from a cruise on 02-13-21, and that same day she started to feel tired and have a dry cough. The cough has continued, and she developed low grade fevers. No headache or body aches. No chest pain or SOB. No NVD. On 02-17-21 she tested positive for the Covid virus. She is drinking fluids and taking Sudafed. She has been able to work at home while [employer].    ROS: See pertinent positives and negatives per HPI.  Past Medical History:  Diagnosis Date   Dysrhythmia    irregular as a child   GERD (gastroesophageal reflux disease)    Hiatal hernia    Infection    UTI 7/12 end of preganacy   Routine gynecological examination    sees Dr. Gerald Leitz     Past Surgical History:  Procedure Laterality Date   HERNIA REPAIR  10/08/11   umbilical hernia repair   TUBAL LIGATION  12/24/2010   Procedure: POST PARTUM TUBAL LIGATION;  Surgeon: [name];  Location: [facility];  Service: Gynecology;  Laterality: Bilateral;   UMBILICAL HERNIA REPAIR  10/08/2011   Procedure: HERNIA REPAIR UMBILICAL ADULT;  Surgeon: Emelia Loron, MD;  Location: [facility];  Service: General;  Laterality: N/A;   WISDOM TOOTH EXTRACTION      Family History  Problem Relation Age of Onset   Heart disease Maternal Grandmother      Current Outpatient  Medications:    IBUPROFEN PO, Take by mouth as needed., Disp: , Rfl:    molnupiravir EUA 200 mg CAPS, Take 4 capsules (800 mg total) by mouth 2 (two) times daily for 5 days., Disp: 40 capsule, Rfl: 0   omeprazole (PRILOSEC) 40 MG capsule, TAKE 1 CAPSULE BY MOUTH EVERY DAY, Disp: 90 capsule, Rfl: 0  EXAM:  VITALS per patient if applicable:  GENERAL: alert, oriented, appears well and in no acute distress  HEENT: atraumatic, conjunttiva clear, no obvious abnormalities on inspection of external nose and ears  NECK: normal movements of the head and neck  LUNGS: on inspection no signs of respiratory distress, breathing rate appears normal, no obvious gross SOB, gasping or wheezing  CV: no obvious cyanosis  MS: moves all visible extremities without noticeable abnormality  PSYCH/NEURO: pleasant and cooperative, no obvious depression or anxiety, speech and thought processing grossly intact  ASSESSMENT AND PLAN: Covid-19 infection. Treat with 5 days of Molnupiravir. Follow up as needed.  Gershon Crane, MD  Discussed the following assessment and plan:  No diagnosis found.     I discussed the assessment and treatment plan with the patient. The patient was provided an opportunity to ask questions and all were answered. The patient agreed with the plan and demonstrated an understanding of the instructions.   The patient was advised to call back or seek an in-person evaluation if the symptoms worsen or  if the condition fails to improve as anticipated.      Review of Systems     Objective:   Physical Exam        Assessment & Plan:

## 2021-02-22 ENCOUNTER — Other Ambulatory Visit: Payer: 59

## 2021-03-28 ENCOUNTER — Ambulatory Visit
Admission: RE | Admit: 2021-03-28 | Discharge: 2021-03-28 | Disposition: A | Discharge status: Home / Self Care | Payer: 59 | Source: Ambulatory Visit | Attending: Obstetrics and Gynecology | Admitting: Obstetrics and Gynecology

## 2021-03-28 ENCOUNTER — Other Ambulatory Visit: Payer: Self-pay

## 2021-03-28 DIAGNOSIS — R928 Other abnormal and inconclusive findings on diagnostic imaging of breast: Secondary | ICD-10-CM

## 2022-01-04 ENCOUNTER — Telehealth: Payer: Self-pay

## 2022-01-04 NOTE — Telephone Encounter (Signed)
Caller states she is calling to get yeast infection medication, she is unaware of what the medication is called. Pt w/vag itching and clear discharge.  01/04/2022 8:52:52 AM See PCP within 2 Wardell Honour, RN, Angelique Blonder  01/04/2022 8:53:50 AM Pharmacy Call Annye English, RN, Angelique Blonder Reason: [facility] w/Diflucan called in per SO.  01/04/2022 8:55:21 AM Call Completed Carmon, RN, Angelique Blonder  Diflucan 150 mg 1 tablet Times 1 dose only, ONLY if not able to make appt within recommended time frame Oral Onetime Dose Carmon, RN, Angelique Blonder  Comments User: Greggory Stallion, RN Date/Time Lamount Cohen Time): 01/04/2022 8:52:30 AM Pharmacy: [facility] [address] [place], [place], [phone] Allergies: NKDA Pt unable to make appt due to work schedule.  Referrals REFERRED TO PCP OFFICE

## 2022-03-30 ENCOUNTER — Other Ambulatory Visit: Payer: Self-pay | Admitting: Obstetrics and Gynecology

## 2022-03-30 DIAGNOSIS — N6320 Unspecified lump in the left breast, unspecified quadrant: Secondary | ICD-10-CM

## 2022-04-11 ENCOUNTER — Ambulatory Visit
Admission: RE | Admit: 2022-04-11 | Discharge: 2022-04-11 | Disposition: A | Discharge status: Home / Self Care | Payer: No Typology Code available for payment source | Source: Ambulatory Visit | Attending: Obstetrics and Gynecology | Admitting: Obstetrics and Gynecology

## 2022-04-11 ENCOUNTER — Ambulatory Visit
Admission: RE | Admit: 2022-04-11 | Discharge: 2022-04-11 | Disposition: A | Discharge status: Home / Self Care | Payer: 59 | Source: Ambulatory Visit | Attending: Obstetrics and Gynecology | Admitting: Obstetrics and Gynecology

## 2022-04-11 DIAGNOSIS — N6320 Unspecified lump in the left breast, unspecified quadrant: Secondary | ICD-10-CM

## 2022-04-25 ENCOUNTER — Encounter: Payer: Self-pay | Admitting: Family Medicine

## 2022-04-25 ENCOUNTER — Ambulatory Visit (INDEPENDENT_AMBULATORY_CARE_PROVIDER_SITE_OTHER): Payer: No Typology Code available for payment source | Admitting: Family Medicine

## 2022-04-25 VITALS — BP 102/78 | HR 78 | Temp 98.2°F | Ht 66.0 in | Wt 185.0 lb

## 2022-04-25 DIAGNOSIS — N390 Urinary tract infection, site not specified: Secondary | ICD-10-CM

## 2022-04-25 DIAGNOSIS — R300 Dysuria: Secondary | ICD-10-CM

## 2022-04-25 LAB — POC URINALSYSI DIPSTICK (AUTOMATED)
Bilirubin, UA: NEGATIVE
Glucose, UA: NEGATIVE
Ketones, UA: NEGATIVE
Leukocytes, UA: NEGATIVE
Nitrite, UA: NEGATIVE
Protein, UA: NEGATIVE
Spec Grav, UA: 1.02 (ref 1.010–1.025)
Urobilinogen, UA: 0.2 E.U./dL
pH, UA: 6 (ref 5.0–8.0)

## 2022-04-25 MED ORDER — CIPROFLOXACIN HCL 500 MG PO TABS: 500.0000 mg | ORAL_TABLET | Freq: Two times a day (BID) | ORAL | 0 refills | Status: DC

## 2022-04-25 MED ORDER — OMEPRAZOLE 40 MG PO CPDR: DELAYED_RELEASE_CAPSULE | ORAL | 3 refills | Status: DC

## 2022-04-25 MED ORDER — FLUCONAZOLE 150 MG PO TABS: 150.0000 mg | ORAL_TABLET | Freq: Every day | ORAL | 5 refills | Status: DC

## 2022-04-25 NOTE — Addendum Note (Signed)
Addended by: Carola Rhine on: 04/25/2022 04:21 PM   Modules accepted: Orders

## 2022-04-25 NOTE — Progress Notes (Signed)
   Subjective:    Patient ID: Rachel Gallagher, female    DOB: [birth year], 41 y.o.   MRN: [id number]  HPI Here for 2 days of low back pain, urgency to urinate and burning at the end of urinations. No fever . She is taking AZO.    Review of Systems  Constitutional: Negative.   Respiratory: Negative.    Cardiovascular: Negative.   Gastrointestinal: Negative.   Genitourinary:  Positive for dysuria, frequency and urgency. Negative for flank pain and hematuria.       Objective:   Physical Exam Constitutional:      Appearance: Normal appearance. She is not ill-appearing.  Cardiovascular:     Rate and Rhythm: Normal rate and regular rhythm.     Pulses: Normal pulses.     Heart sounds: Normal heart sounds.  Pulmonary:     Effort: Pulmonary effort is normal.     Breath sounds: Normal breath sounds.  Abdominal:     Tenderness: There is no right CVA tenderness or left CVA tenderness.  Neurological:     Mental Status: She is alert.           Assessment & Plan:  UTI, treat with 7 days of Cipro. Culture the sample.  Gershon Crane, MD

## 2022-04-28 LAB — URINE CULTURE
MICRO NUMBER:: 14194127
SPECIMEN QUALITY:: ADEQUATE

## 2022-06-14 IMAGING — US US BREAST*L* LIMITED INC AXILLA
1 series · 13 of 21 positions shown · non-contrast
Comparison: Previous exam(s).

CLINICAL DATA: 40-year-old female presenting for annual bilateral
mammogram and 1 year follow-up of probably benign bilateral masses.

EXAM:
DIGITAL DIAGNOSTIC BILATERAL MAMMOGRAM WITH TOMOSYNTHESIS AND CAD;
ULTRASOUND LEFT BREAST LIMITED
TECHNIQUE: Bilateral digital diagnostic mammography and breast tomosynthesis
was performed. The images were evaluated with computer-aided
detection.; Targeted ultrasound examination of the left breast was
performed.

[Series 1: us breast*left* limited inc axilla · 0.06mm/px · 13 of 21 slices shown]
[im 1/21]
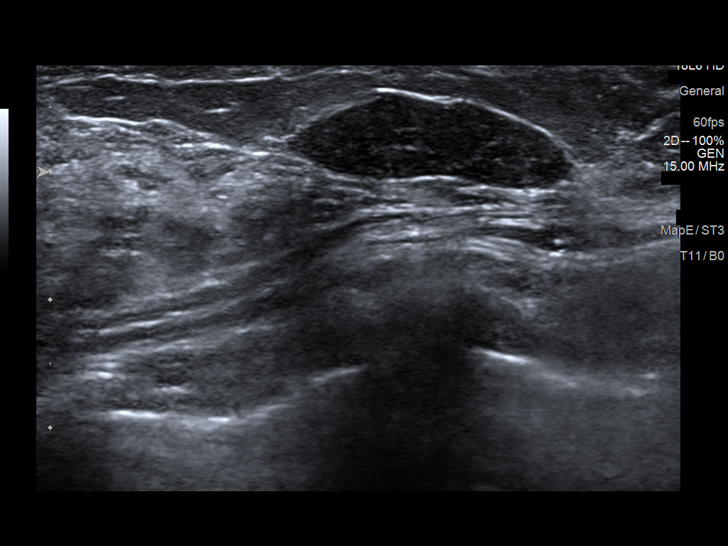
[im 3/21]
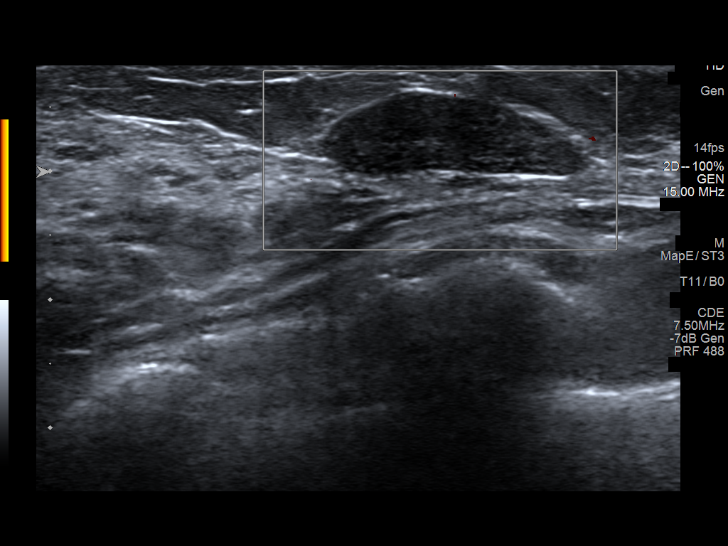
[im 5/21]
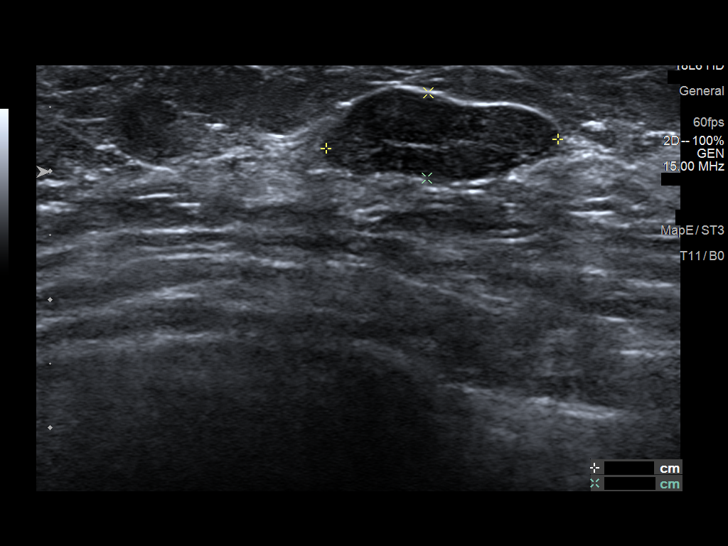
[im 6/21]
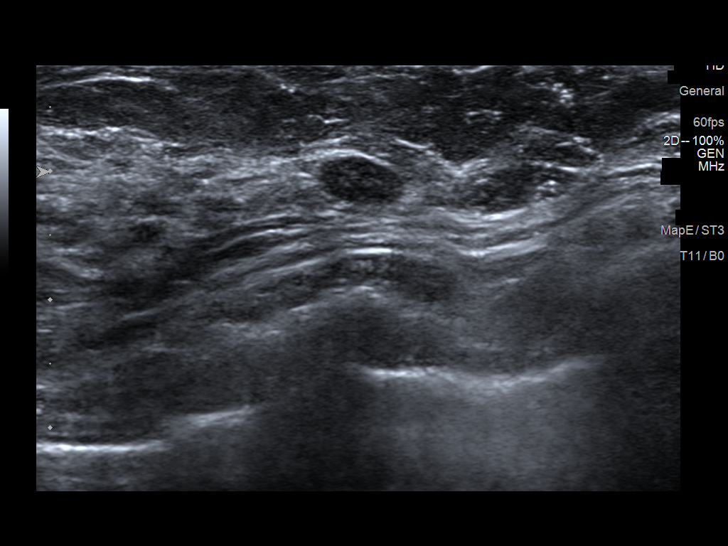
[im 8/21]
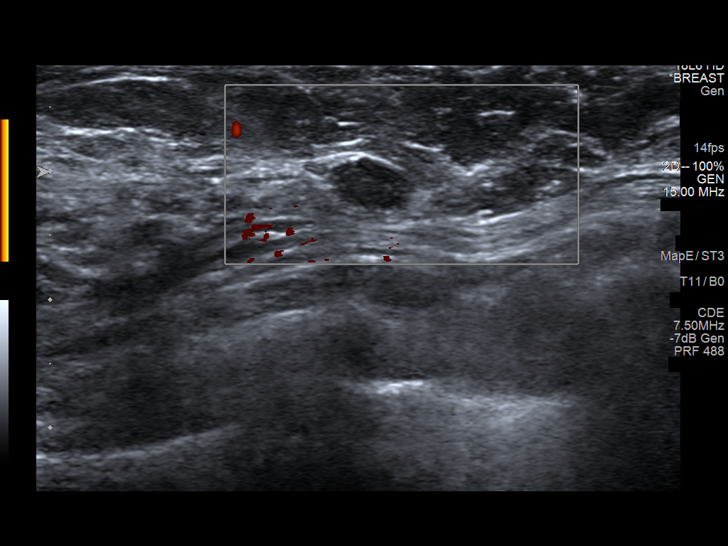
[im 9/21]
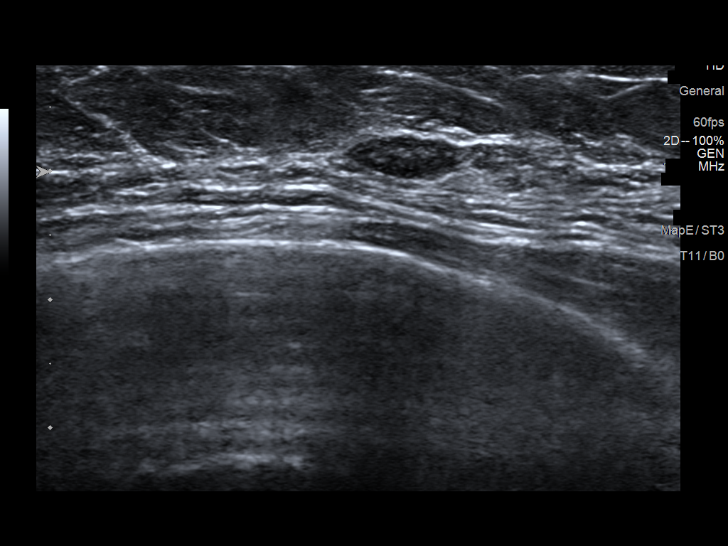
[im 11/21]
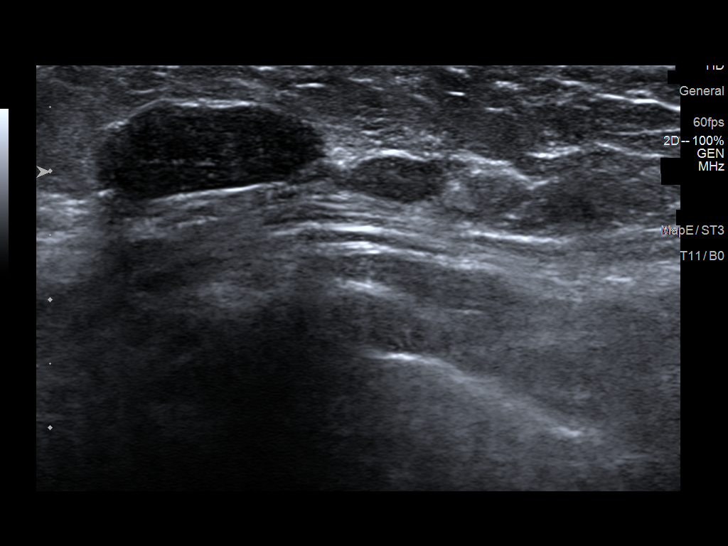
[im 13/21]
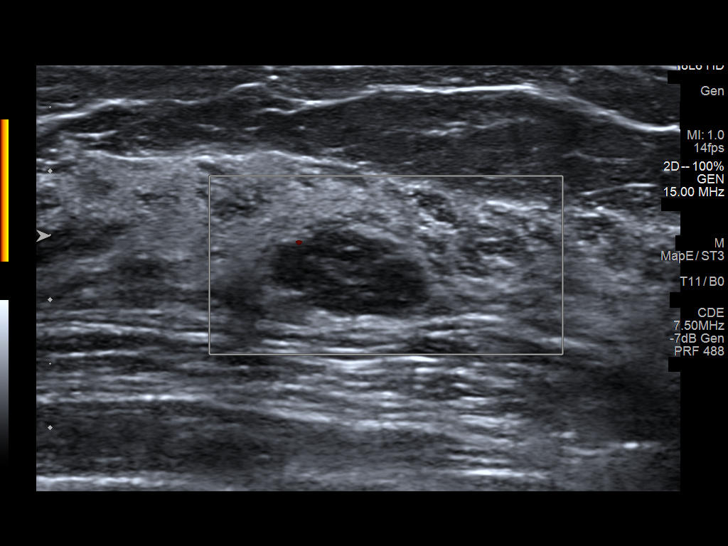
[im 14/21]
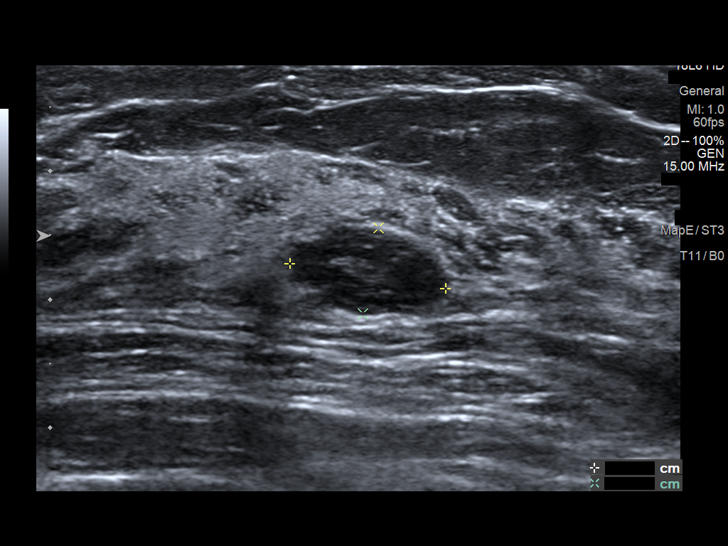
[im 16/21]
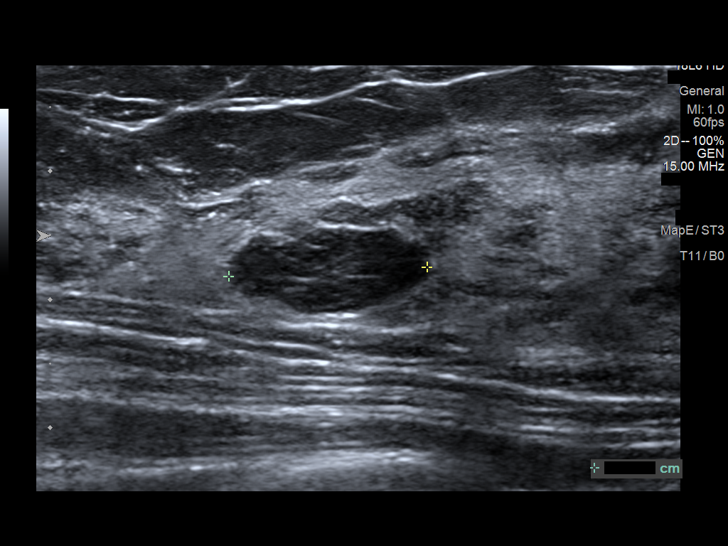
[im 17/21]
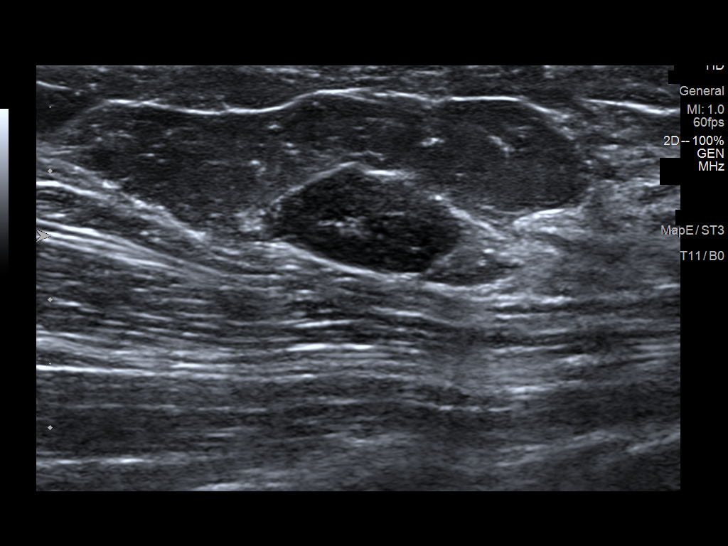
[im 19/21]
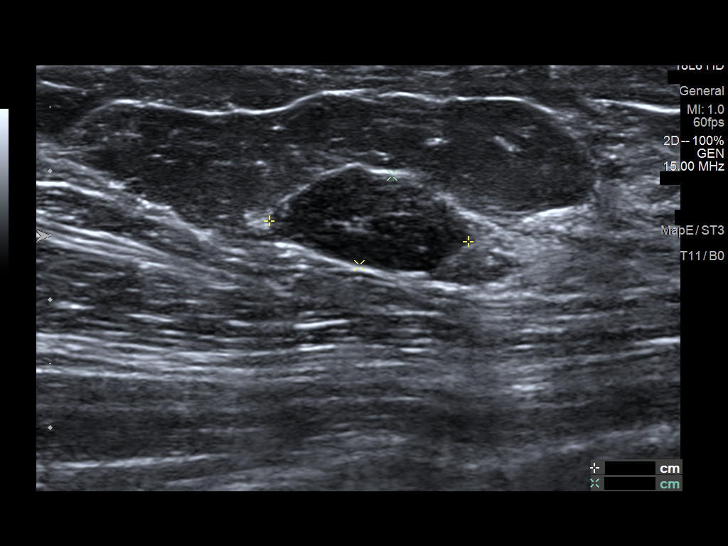
[im 21/21]
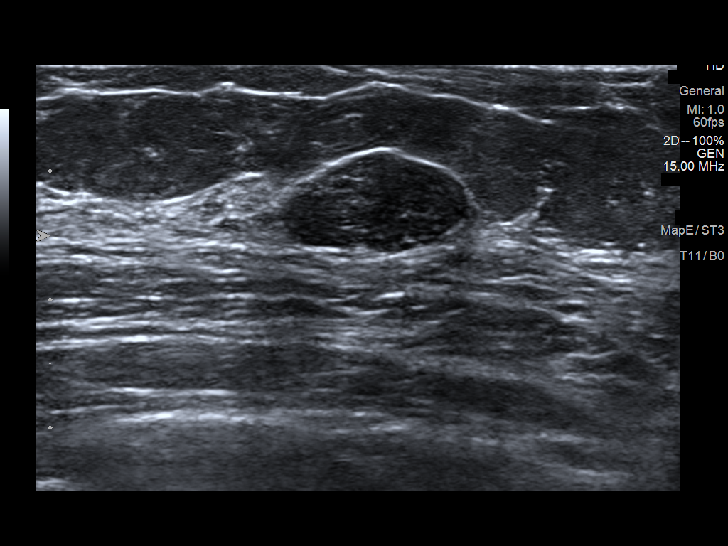

[13 of 21 positions shown; findings below may reference images not displayed]

ACR Breast Density Category d: The breast tissue is extremely dense,
which lowers the sensitivity of mammography.
FINDINGS: The parenchymal pattern is stable. Circumscribed masses in the upper
and lower left breast are unchanged. No new or suspicious findings
identified.

Targeted ultrasound is performed, showing stable appearance of
multiple oval, circumscribed hypoechoic masses in the left breast.
This includes a mass at the 5 o'clock position 5 cm from the nipple
measuring 2.2 x 1.8 x 0.7 cm (previously 2.4 x 0.7 x 2 cm), a mass
at the 5 o'clock position 6 cm from the nipple measuring 7 x 8 x 3
mm (previously 7 x 3 x 9 mm), a mass at the 12 o'clock position 3 cm
from the nipple measuring 1.6 x 1.2 x 0.7 cm (previously 1.6 x 0.6 x
1.1 cm and a mass at the 12 o'clock position 5 cm from the nipple
measuring 1.4 x 1.6 x 0.8 cm (previously 2 x 0.7 x 1.5 cm). Slight
variations are due to positioning and technique.
IMPRESSION: 1. Stable, probably benign left breast masses. Recommend a final
ultrasound follow-up in 1 year.
2. No mammographic evidence of malignancy on the right.

RECOMMENDATION:
Bilateral diagnostic mammogram and left breast ultrasound in 1 year.

I have discussed the findings and recommendations with the patient.
If applicable, a reminder letter will be sent to the patient
regarding the next appointment.

BI-RADS CATEGORY  3: Probably benign.

## 2022-06-19 ENCOUNTER — Telehealth: Payer: Self-pay

## 2022-06-19 NOTE — Telephone Encounter (Signed)
---  Caller states that she has been sick for 2 weeks, she still has a cough, she would like to get a prescription for cough medicine called in for her.  06/19/2022 10:47:14 AM See PCP within 24 Hours Standifer, RN, Heather  Referrals REFERRED TO PCP OFFICE  06/19/22 1411 - Pt states she "wants to give it a couple more days" b/c she doesn't want to go to the doctor. States she doing better & plans to continue to take OTC meds. Pt states mild congestion & dry cough that's worse at night. Denies fever or SOB.

## 2022-08-08 ENCOUNTER — Encounter: Payer: Self-pay | Admitting: Family Medicine

## 2022-08-08 ENCOUNTER — Ambulatory Visit (INDEPENDENT_AMBULATORY_CARE_PROVIDER_SITE_OTHER): Payer: 59 | Admitting: Family Medicine

## 2022-08-08 VITALS — BP 118/80 | HR 73 | Temp 98.0°F | Ht 67.0 in | Wt 185.4 lb

## 2022-08-08 DIAGNOSIS — Z0001 Encounter for general adult medical examination with abnormal findings: Secondary | ICD-10-CM

## 2022-08-08 DIAGNOSIS — Z23 Encounter for immunization: Secondary | ICD-10-CM | POA: Diagnosis not present

## 2022-08-08 DIAGNOSIS — R42 Dizziness and giddiness: Secondary | ICD-10-CM

## 2022-08-08 DIAGNOSIS — Z0000 Encounter for general adult medical examination without abnormal findings: Secondary | ICD-10-CM

## 2022-08-08 LAB — LIPID PANEL
Cholesterol: 209 mg/dL — ABNORMAL HIGH (ref 0–200)
HDL: 66.9 mg/dL (ref >39.00)
LDL Cholesterol: 125 mg/dL — ABNORMAL HIGH (ref 0–99)
NonHDL: 141.69
Total CHOL/HDL Ratio: 3
Triglycerides: 82 mg/dL (ref 0.0–149.0)
VLDL: 16.4 mg/dL (ref 0.0–40.0)

## 2022-08-08 LAB — BASIC METABOLIC PANEL
BUN: 15 mg/dL (ref 6–23)
CO2: 25 mEq/L (ref 19–32)
Calcium: 10.1 mg/dL (ref 8.4–10.5)
Chloride: 105 mEq/L (ref 96–112)
Creatinine, Ser: 0.77 mg/dL (ref 0.40–1.20)
GFR: 94.82 mL/min (ref >60.00)
Glucose, Bld: 85 mg/dL (ref 70–99)
Potassium: 4.2 mEq/L (ref 3.5–5.1)
Sodium: 138 mEq/L (ref 135–145)

## 2022-08-08 LAB — CBC WITH DIFFERENTIAL/PLATELET
Basophils Absolute: 0 10*3/uL (ref 0.0–0.1)
Basophils Relative: 0.3 % (ref 0.0–3.0)
Eosinophils Absolute: 0 10*3/uL (ref 0.0–0.7)
Eosinophils Relative: 0.6 % (ref 0.0–5.0)
HCT: 42.3 % (ref 36.0–46.0)
Hemoglobin: 14 g/dL (ref 12.0–15.0)
Lymphocytes Relative: 39.8 % (ref 12.0–46.0)
Lymphs Abs: 2.3 10*3/uL (ref 0.7–4.0)
MCHC: 33.1 g/dL (ref 30.0–36.0)
MCV: 94.3 fl (ref 78.0–100.0)
Monocytes Absolute: 0.3 10*3/uL (ref 0.1–1.0)
Monocytes Relative: 5.7 % (ref 3.0–12.0)
Neutro Abs: 3.1 10*3/uL (ref 1.4–7.7)
Neutrophils Relative %: 53.6 % (ref 43.0–77.0)
Platelets: 224 10*3/uL (ref 150.0–400.0)
RBC: 4.48 Mil/uL (ref 3.87–5.11)
RDW: 13.6 % (ref 11.5–15.5)
WBC: 5.7 10*3/uL (ref 4.0–10.5)

## 2022-08-08 LAB — HEPATIC FUNCTION PANEL
ALT: 27 U/L (ref 0–35)
AST: 36 U/L (ref 0–37)
Albumin: 4.6 g/dL (ref 3.5–5.2)
Alkaline Phosphatase: 63 U/L (ref 39–117)
Bilirubin, Direct: 0.1 mg/dL (ref 0.0–0.3)
Total Bilirubin: 0.5 mg/dL (ref 0.2–1.2)
Total Protein: 8.1 g/dL (ref 6.0–8.3)

## 2022-08-08 LAB — HEMOGLOBIN A1C: Hgb A1c MFr Bld: 5.4 % (ref 4.6–6.5)

## 2022-08-08 LAB — TSH: TSH: 1.04 u[IU]/mL (ref 0.35–5.50)

## 2022-08-08 MED ORDER — SCOPOLAMINE 1 MG/3DAYS TD PT72: 1.0000 | MEDICATED_PATCH | TRANSDERMAL | 2 refills | Status: DC

## 2022-08-08 NOTE — Addendum Note (Signed)
Addended by: Wyvonne Lenz on: 08/08/2022 11:11 AM   Modules accepted: Orders

## 2022-08-08 NOTE — Progress Notes (Signed)
Subjective:    Patient ID: Rachel Gallagher, female    DOB: 01-Nov-1980, 42 y.o.   MRN: [id number]  HPI Here for a well exam. She has a few issues to discuss. First on 2-21-2r while she was exercising in her living room she cut her left heel on the edge of a metal table. She was able to stop the bleeding, and she has been dressing it with peroxide. The area is still tender, but seems to be healing. Second she has frequent episodes of vertigo, about 1 or a month. These never last more than a day. She has some nausea with them but has never vomited. She has tried Meclizine in the past with no relief. Third she will be going on a cruise to the [place] in a few weeks, and she asks what she can do to prevent seasickness.    Review of Systems  Constitutional: Negative.   HENT: Negative.    Eyes: Negative.   Respiratory: Negative.    Cardiovascular: Negative.   Gastrointestinal: Negative.   Genitourinary:  Negative for decreased urine volume, difficulty urinating, dyspareunia, dysuria, enuresis, flank pain, frequency, hematuria, pelvic pain and urgency.  Musculoskeletal: Negative.   Skin:  Positive for wound.  Neurological:  Positive for dizziness. Negative for headaches.  Psychiatric/Behavioral: Negative.         Objective:   Physical Exam Constitutional:      General: She is not in acute distress.    Appearance: Normal appearance. She is well-developed.  HENT:     Head: Normocephalic and atraumatic.     Right Ear: External ear normal.     Left Ear: External ear normal.     Nose: Nose normal.     Mouth/Throat:     Pharynx: No oropharyngeal exudate.  Eyes:     General: No scleral icterus.    Conjunctiva/sclera: Conjunctivae normal.     Pupils: Pupils are equal, round, and reactive to light.  Neck:     Thyroid: No thyromegaly.     Vascular: No JVD.  Cardiovascular:     Rate and Rhythm: Normal rate and regular rhythm.     Heart sounds: Normal heart sounds. No murmur heard.     No friction rub. No gallop.  Pulmonary:     Effort: Pulmonary effort is normal. No respiratory distress.     Breath sounds: Normal breath sounds. No wheezing or rales.  Chest:     Chest wall: No tenderness.  Abdominal:     General: Bowel sounds are normal. There is no distension.     Palpations: Abdomen is soft. There is no mass.     Tenderness: There is no abdominal tenderness. There is no guarding or rebound.  Musculoskeletal:        General: No tenderness. Normal range of motion.     Cervical back: Normal range of motion and neck supple.  Lymphadenopathy:     Cervical: No cervical adenopathy.  Skin:    General: Skin is warm and dry.     Findings: No erythema.     Comments: There is a 3 cm laceration on the bottom of the left heel that is closed. This is slightly tender. No erythema or warmth  Neurological:     Mental Status: She is alert and oriented to person, place, and time.     Cranial Nerves: No cranial nerve deficit.     Motor: No abnormal muscle tone.     Coordination: Coordination normal.  Deep Tendon Reflexes: Reflexes are normal and symmetric. Reflexes normal.  Psychiatric:        Behavior: Behavior normal.        Thought Content: Thought content normal.        Judgment: Judgment normal.           Assessment & Plan:  Well exam. We discussed diet and exercise. Get fasting labs. The laceration seems to be healing well with no signs of infection. Given a TDaP today. We will refer her to vestibular rehab for the vertigo. She will use transdermal scopolamine patches on her cruise.  Alysia Penna, MD

## 2022-08-15 ENCOUNTER — Ambulatory Visit: Payer: No Typology Code available for payment source | Attending: Family Medicine

## 2022-08-15 DIAGNOSIS — R2681 Unsteadiness on feet: Secondary | ICD-10-CM | POA: Insufficient documentation

## 2022-08-15 DIAGNOSIS — R42 Dizziness and giddiness: Secondary | ICD-10-CM | POA: Diagnosis present

## 2022-08-15 NOTE — Therapy (Unsigned)
OUTPATIENT PHYSICAL THERAPY VESTIBULAR EVALUATION     Patient Name: Rachel Gallagher MRN: [id number] DOB:05/09/1981, 42 y.o., female Today's Date: 08/15/2022  END OF SESSION:  PT End of Session - 08/15/22 1513     Visit Number 1    Number of Visits 4    Date for PT Re-Evaluation 11/07/22    Authorization Type [employer]    PT Start Time 1520    PT Stop Time 1600    PT Time Calculation (min) 40 min    Activity Tolerance Patient tolerated treatment well    Behavior During Therapy [facility] for tasks assessed/performed             Past Medical History:  Diagnosis Date   Dysrhythmia    irregular as a child   GERD (gastroesophageal reflux disease)    Hiatal hernia    Infection    UTI 7/12 end of preganacy   Routine gynecological examination    sees Dr. Christophe Louis    Past Surgical History:  Procedure Laterality Date   HERNIA REPAIR  [id number]   umbilical hernia repair   TUBAL LIGATION  12/24/2010   Procedure: POST PARTUM TUBAL LIGATION;  Surgeon: Catha Brow;  Location: [facility];  Service: Gynecology;  Laterality: Bilateral;   UMBILICAL HERNIA REPAIR  10/08/2011   Procedure: HERNIA REPAIR UMBILICAL ADULT;  Surgeon: Rolm Bookbinder, MD;  Location: [facility];  Service: General;  Laterality: N/A;   WISDOM TOOTH EXTRACTION     Patient Active Problem List   Diagnosis Date Noted   Vertigo 08/08/2022   COVID-19 virus infection 02/20/2021   Post-dates pregnancy 12/22/2010    Class: Temporary   GERD 02/01/2010   TMJ PAIN 12/22/2008   INGUINAL LYMPHADENOPATHY, RIGHT 07/22/2008   HYPERTROPHY OF BREAST 03/12/2008    PCP: Laurey Morale, MD REFERRING PROVIDER: Laurey Morale, MD  REFERRING DIAG: R42 (ICD-10-CM) - Vertigo  THERAPY DIAG:  Unsteadiness on feet  Dizziness and giddiness  ONSET DATE: 08/08/2022 referral  Rationale for Evaluation and Treatment: Rehabilitation  SUBJECTIVE:   SUBJECTIVE STATEMENT: Patient arrives to clinic alone without AD. Reports having "spells" that  used to occur every 3-4 months and now it occurs ~1 a month. Was taking Meclizine, but made her too sleepy. Has not taken since early Feb. "Spells" used to make her throw up, now she only experiences nausea. "Spells" feel like a "really bad hangover." No HA. Was told she had an inner ear infection ~2 years ago- did not receive abx. Per patient, PCP told her to take Bonine instead. Reports more fullness in her ear at the time than pain. Most recent "spell" seemed to improve in a darkened room reporting some photosensitivity. Denies h/o migraines.  Pt accompanied by: self  PERTINENT HISTORY: GERD, previous COVID-19 infection  PAIN:  Are you having pain? No  PRECAUTIONS: None  WEIGHT BEARING RESTRICTIONS: No  FALLS: Has patient fallen in last 6 months? No  LIVING ENVIRONMENT: Lives with: lives with their spouse Lives in: House/apartment Stairs: Yes: Internal: flight steps; on right going up Has following equipment at home: None  PLOF: Independent   PATIENT GOALS: "I don't want to have spells anymore"  OBJECTIVE:   COGNITION: Overall cognitive status: Within functional limits for tasks assessed   SENSATION: WFL  POSTURE:  No Significant postural limitations  Cervical ROM:   WFL Active A/PROM (deg) eval  Flexion   Extension   Right lateral flexion   Left lateral flexion   Right rotation  Left rotation   (Blank rows = not tested)  STRENGTH: WFL  BED MOBILITY:  Able to complete independently, but does report some dizziness with rolling when experiencing her "spells"  TRANSFERS: Assistive device utilized: None  Sit to stand: Complete Independence Stand to sit: Complete Independence Chair to chair: Complete Independence  GAIT: Gait pattern: WFL  FUNCTIONAL TESTS:  M-CTSIB:  Condition 1: 30s no sway Condition 2: 30s mild sway Condition 3: 30s no sway Condition 4: 30s mild sway   PATIENT SURVEYS:  FOTO 50; expected to be at 60  VESTIBULAR  ASSESSMENT:  GENERAL OBSERVATION: NAD   SYMPTOM BEHAVIOR:  Subjective history: see above  Non-Vestibular symptoms: nausea/vomiting  Type of dizziness: "Swimmyheaded" and "like a bad hangover"  Frequency: 1x a month roughly  Duration: last a couple days  Aggravating factors: Induced by position change: rolling to the right and rolling to the left, Induced by motion: turning body quickly and turning head quickly, and Worse in the morning  Relieving factors: dark room and ride it out  Progression of symptoms: worse  OCULOMOTOR EXAM:  Ocular Alignment: normal  Ocular ROM: No Limitations  Spontaneous Nystagmus: absent  Gaze-Induced Nystagmus: absent  Smooth Pursuits: intact  Saccades: intact  Convergence/Divergence: <5 cm   VESTIBULAR - OCULAR REFLEX:   Slow VOR: Normal  VOR Cancellation: Normal  Head-Impulse Test: HIT Right: negative HIT Left: negative  Dynamic Visual Acuity: Static: 11 Dynamic: 7   POSITIONAL TESTING: Right Dix-Hallpike: no nystagmus Left Dix-Hallpike: no nystagmus Right Roll Test: no nystagmus Left Roll Test: no nystagmus  MOTION SENSITIVITY:  Motion Sensitivity Quotient Intensity: 0 = none, 1 = Lightheaded, 2 = Mild, 3 = Moderate, 4 = Severe, 5 = Vomiting  Intensity  1. Sitting to supine 0  2. Supine to L side 0  3. Supine to R side 0  4. Supine to sitting 0  5. L Hallpike-Dix 1  6. Up from L  2  7. R Hallpike-Dix 1  8. Up from R  1  9. Sitting, head tipped to L knee 0  10. Head up from L knee 0  11. Sitting, head tipped to R knee 0  12. Head up from R knee 0  13. Sitting head turns x5 0  14.Sitting head nods x5 0  15. In stance, 180 turn to L  0  16. In stance, 180 turn to R 0     VESTIBULAR TREATMENT:                                                                                                   N/A eval   PATIENT EDUCATION: Education details: exam findings, PT POC, use of accupressure band for seasickness on upcoming  cruise Person educated: Patient Education method: Explanation Education comprehension: verbalized understanding  HOME EXERCISE PROGRAM:  GOALS: Goals reviewed with patient? Yes  SHORT TERM GOALS: = LTG based on PT POC  LONG TERM GOALS: Target date: 11/07/22  Pt will be independent with final HEP as indicated for improved symptom report  Baseline: to be provided if needed Goal status:  INITIAL  2.  Further goals to be set based on patient presentation when returning within a "spell" Baseline: to be assessed Goal status: INITIAL    ASSESSMENT:  CLINICAL IMPRESSION: Patient is a 42 y.o. female who was seen today for physical therapy evaluation and treatment for episodic dizziness.  Vestibular migraine?   OBJECTIVE IMPAIRMENTS: dizziness.   ACTIVITY LIMITATIONS: bending, bed mobility, and locomotion levelduring dizzy spell  PARTICIPATION LIMITATIONS: interpersonal relationship, driving, shopping, community activity, occupation, and yard work during dizzy spell  PERSONAL FACTORS: Age, Past/current experiences, Sex, Time since onset of injury/illness/exacerbation, and 1-2 comorbidities: GERD, previous COVID-19 infection  are also affecting patient's functional outcome.   REHAB POTENTIAL: Good  CLINICAL DECISION MAKING: Stable/uncomplicated  EVALUATION COMPLEXITY: Low   PLAN:  PT FREQUENCY: 1x/week  PT DURATION: 4 weeks  PLANNED INTERVENTIONS: Therapeutic exercises, Therapeutic activity, Neuromuscular re-education, Balance training, Gait training, Patient/Family education, Self Care, Joint mobilization, Stair training, Vestibular training, Canalith repositioning, Visual/preceptual remediation/compensation, DME instructions, Aquatic Therapy, Manual therapy, and Re-evaluation  PLAN FOR NEXT SESSION: re-assess vestib during dizzy spell    Debbora Dus, PT Debbora Dus, PT, DPT, CBIS  08/15/2022, 4:06 PM

## 2023-06-25 ENCOUNTER — Encounter: Payer: Self-pay | Admitting: Family Medicine

## 2023-06-25 ENCOUNTER — Ambulatory Visit: Payer: Self-pay | Admitting: Family Medicine

## 2023-06-25 ENCOUNTER — Ambulatory Visit: Payer: No Typology Code available for payment source | Admitting: Family Medicine

## 2023-06-25 VITALS — BP 120/80 | HR 125 | Temp 98.2°F | Ht 67.0 in | Wt 186.2 lb

## 2023-06-25 DIAGNOSIS — Z20828 Contact with and (suspected) exposure to other viral communicable diseases: Secondary | ICD-10-CM | POA: Diagnosis not present

## 2023-06-25 DIAGNOSIS — R6889 Other general symptoms and signs: Secondary | ICD-10-CM | POA: Diagnosis not present

## 2023-06-25 DIAGNOSIS — R051 Acute cough: Secondary | ICD-10-CM

## 2023-06-25 LAB — POC INFLUENZA A&B (BINAX/QUICKVUE)
Influenza A, POC: NEGATIVE
Influenza B, POC: NEGATIVE

## 2023-06-25 LAB — POC COVID19 BINAXNOW: SARS Coronavirus 2 Ag: NEGATIVE

## 2023-06-25 MED ORDER — ONDANSETRON 4 MG PO TBDP: 4.0000 mg | ORAL_TABLET | Freq: Three times a day (TID) | ORAL | 0 refills | Status: DC | PRN

## 2023-06-25 MED ORDER — PROMETHAZINE HCL 25 MG/ML IJ SOLN
25.0000 mg | Freq: Once | INTRAMUSCULAR | Status: AC
  Administered 2023-06-25: 25 mg via INTRAMUSCULAR

## 2023-06-25 MED ORDER — OSELTAMIVIR PHOSPHATE 75 MG PO CAPS: 75.0000 mg | ORAL_CAPSULE | Freq: Two times a day (BID) | ORAL | 0 refills | Status: DC

## 2023-06-25 NOTE — Progress Notes (Signed)
 Patient ID: Rachel Gallagher, female    DOB: 23-Apr-1981, 43 y.o.   MRN: [id number]  This visit was conducted in person.  BP 120/80 (BP Location: Left Arm, Patient Position: Sitting, Cuff Size: Large)   Pulse (!) 125   Temp 98.2 F (36.8 C) (Temporal)   Ht 5' 7 (1.702 m)   Wt 186 lb 4 oz (84.5 kg)   LMP 06/25/2023   SpO2 98%   BMI 29.17 kg/m    CC:  Chief Complaint  Patient presents with   Cough    Started today-Son tested positive for flu today   Headache   Generalized Body Aches        Vomiting    Subjective:   HPI: Rachel Gallagher is a 43 y.o. female presenting on 06/25/2023 for Cough (Started today-Son tested positive for flu today), Headache, Generalized Body Aches (/), and Vomiting   Date of onset:  yesterday Initial symptoms included  headache, generalized body aches, sinus pressure and congestion Symptoms progressed to cough and  nausea/vomiting.  Not able to keep down liquids today... only few ounces.   Sick contacts: Son with flu  COVID testing:   none     She has tried to treat with  tylenol .. keeps throwing it up.     No history of chronic lung disease such as asthma or COPD. Non-smoker.       Relevant past medical, surgical, family and social history reviewed and updated as indicated. Interim medical history since our last visit reviewed. Allergies and medications reviewed and updated. Outpatient Medications Prior to Visit  Medication Sig Dispense Refill   IBUPROFEN  PO Take by mouth as needed.     omeprazole  (PRILOSEC) 40 MG capsule TAKE 1 CAPSULE BY MOUTH EVERY DAY 90 capsule 3   scopolamine  (TRANSDERM SCOP , 1.5 MG,) 1 MG/3DAYS Place 1 patch (1.5 mg total) onto the skin every 3 (three) days. 10 patch 2   DiphenhydrAMINE  HCl (BENADRYL  ALLERGY PO) Take by mouth.     famotidine  (PEPCID  AC) 10 MG chewable tablet Chew 10 mg by mouth 2 (two) times daily as needed. Heart burn      No facility-administered medications prior to visit.     Per  HPI unless specifically indicated in ROS section below Review of Systems  Constitutional:  Positive for fatigue and fever.  HENT:  Positive for congestion, sinus pressure and sinus pain.   Eyes:  Negative for pain.  Respiratory:  Positive for cough. Negative for shortness of breath.   Cardiovascular:  Negative for chest pain, palpitations and leg swelling.  Gastrointestinal:  Positive for nausea and vomiting. Negative for abdominal pain.  Genitourinary:  Negative for dysuria and vaginal bleeding.  Musculoskeletal:  Negative for back pain.  Neurological:  Negative for syncope, light-headedness and headaches.  Psychiatric/Behavioral:  Negative for dysphoric mood.    Objective:  BP 120/80 (BP Location: Left Arm, Patient Position: Sitting, Cuff Size: Large)   Pulse (!) 125   Temp 98.2 F (36.8 C) (Temporal)   Ht 5' 7 (1.702 m)   Wt 186 lb 4 oz (84.5 kg)   LMP 06/25/2023   SpO2 98%   BMI 29.17 kg/m   Wt Readings from Last 3 Encounters:  06/25/23 186 lb 4 oz (84.5 kg)  08/08/22 185 lb 6.4 oz (84.1 kg)  04/25/22 185 lb (83.9 kg)      Physical Exam Constitutional:      General: She is not in acute distress.  Appearance: Normal appearance. She is well-developed. She is ill-appearing. She is not toxic-appearing.  HENT:     Head: Normocephalic.     Right Ear: Hearing, tympanic membrane, ear canal and external ear normal. Tympanic membrane is not erythematous, retracted or bulging.     Left Ear: Hearing, tympanic membrane, ear canal and external ear normal. Tympanic membrane is not erythematous, retracted or bulging.     Nose: Rhinorrhea present. No mucosal edema.     Right Turbinates: Swollen.     Left Turbinates: Swollen.     Right Sinus: Frontal sinus tenderness present. No maxillary sinus tenderness.     Left Sinus: Frontal sinus tenderness present. No maxillary sinus tenderness.     Mouth/Throat:     Pharynx: Uvula midline.  Eyes:     General: Lids are normal. Lids are  everted, no foreign bodies appreciated.     Conjunctiva/sclera: Conjunctivae normal.     Pupils: Pupils are equal, round, and reactive to light.  Neck:     Thyroid : No thyroid  mass or thyromegaly.     Vascular: No carotid bruit.     Trachea: Trachea normal.  Cardiovascular:     Rate and Rhythm: Normal rate and regular rhythm.     Pulses: Normal pulses.     Heart sounds: Normal heart sounds, S1 normal and S2 normal. No murmur heard.    No friction rub. No gallop.  Pulmonary:     Effort: Pulmonary effort is normal. No tachypnea or respiratory distress.     Breath sounds: Normal breath sounds. No decreased breath sounds, wheezing, rhonchi or rales.  Abdominal:     General: Bowel sounds are normal.     Palpations: Abdomen is soft.     Tenderness: There is no abdominal tenderness.  Musculoskeletal:     Cervical back: Normal range of motion and neck supple.  Skin:    General: Skin is warm and dry.     Findings: No rash.  Neurological:     Mental Status: She is alert.  Psychiatric:        Mood and Affect: Mood is not anxious or depressed.        Speech: Speech normal.        Behavior: Behavior normal. Behavior is cooperative.        Thought Content: Thought content normal.        Judgment: Judgment normal.       Results for orders placed or performed in visit on 06/25/23  POC Influenza A&B (Binax test)   Collection Time: 06/25/23  3:47 PM  Result Value Ref Range   Influenza A, POC Negative Negative   Influenza B, POC Negative Negative  POC COVID-19   Collection Time: 06/25/23  3:47 PM  Result Value Ref Range   SARS Coronavirus 2 Ag Negative Negative    Assessment and Plan  Acute cough -     POC Influenza A&B(BINAX/QUICKVUE) -     POC COVID-19 BinaxNow  Flu-like symptoms  Exposure to the flu  Other orders -     Ondansetron ; Take 1 tablet (4 mg total) by mouth every 8 (eight) hours as needed for nausea or vomiting.  Dispense: 20 tablet; Refill: 0 -     Oseltamivir   Phosphate; Take 1 capsule (75 mg total) by mouth 2 (two) times daily.  Dispense: 10 capsule; Refill: 0  Symptoms in office most consistent with influenza.  Testing negative but patient with known influenza trigger in household.  Will treat empirically with  Tamiflu  75 mg p.o. twice daily for presumed flu. Some evidence of mild dehydration, encourage fluid intake over the next 12 hours.  Given Phenergan  25 mg injection in office today, patient provided with Zofran  4 mg sublingual to use as needed for nausea at home.  If unable to keep down fluids will go to emergency room for IV fluids.  Return and ER precautions provided.  No follow-ups on file.   Greig Ring, MD

## 2023-06-25 NOTE — Telephone Encounter (Signed)
 Copied from CRM [id number]. Topic: Clinical - Red Word Triage >> Jun 25, 2023  8:04 AM Robinson DEL wrote: [facility] that prompted transfer to Nurse Triage: Might have flu or covid, feeling terrible. Congested, coughing, body aches, chills hot and cold. Headache 8   Chief Complaint: sinus pain and pressure Symptoms: sinus pain 8/10 moderate and pressure to forehead and cheeks, congestion, fatigue, cough Frequency: continual Pertinent Negatives: Patient denies runny nose, chest pain, SOB Disposition: [] 911 / [] ED /[] Urgent Care (no appt availability in office) / [x] Appointment(In office/virtual)/ []  [facility]/ [] Home Care/ [] Refused Recommended Disposition /[] [facility]/ []  Follow-up with PCP Additional Notes: Pt reporting 8/10 pain and pressure to forehead and under eyes by nose that started this morning, symptoms started with coughing last night. Pt reporting feeling hot and cold, hot and cold, but denies sweating and has not yet assessed temp. Pt reporting she has not yet taken tylenol  or motrin  for pain or temp, can't even get out of bed for real, pt got out of bed on phone call, not . Pt confirms no trouble breathing through nose or mouth, no SOB or chest pain. Pt reporting coughing but nothing coming up, feels like something wants to come up but can't. Advised pt be examined within 24 hours, pt requesting appt today, no availability with PCP today, scheduled appt with [facility] today. Gave pt location info for other office. Pt verbalized understanding.    Reason for Disposition  [1] Sinus pain (not just congestion) AND [2] fever  Answer Assessment - Initial Assessment Questions 1. LOCATION: Where does it hurt?      Got lot of sinus pressure in front of forehead, under eyes, feels like someone's been hitting me on nose this morning 2. ONSET: When did the sinus pain start?  (e.g., hours, days)      Yesterday evening 3. SEVERITY: How bad is the pain?    (Scale 1-10; mild, moderate or severe)   - MILD (1-3): doesn't interfere with normal activities    - MODERATE (4-7): interferes with normal activities (e.g., work or school) or awakens from sleep   - SEVERE (8-10): excruciating pain and patient unable to do any normal activities        Really bad headache, 8/10, just took some tylenol  cold last night, feel worse this morning than did last night. States moderate pain 4. RECURRENT SYMPTOM: Have you ever had sinus problems before? If Yes, ask: When was the last time? and What happened that time?      Sinus infection before 5. NASAL CONGESTION: Is the nose blocked? If Yes, ask: Can you open it or must you breathe through your mouth?     Can breathe through nose, feels really dry and full but no mucus comes out 6. NASAL DISCHARGE: Do you have discharge from your nose? If so ask, What color?     denies 7. FEVER: Do you have a fever? If Yes, ask: What is it, how was it measured, and when did it start?      Not taken temp, Can't even get out of bed for real, denies shivering shaking, just hot and cold, no sweating.  8. OTHER SYMPTOMS: Do you have any other symptoms? (e.g., sore throat, cough, earache, difficulty breathing)     Coughing like something wants to come up but it can't. Throat is just a little irritated from the coughing. No appetite 9. PREGNANCY: Is there any chance you are pregnant? When was your  last menstrual period?     denies  Protocols used: Sinus Pain or Congestion-A-AH

## 2023-06-25 NOTE — Patient Instructions (Signed)
 Rest, push fluids.  Can use zofran for nausea.  Compelte tamiflu course x 5 days.  If not able to keep down liquids in next 12 hours.Marland Kitchen go to ER for IV fluids.

## 2023-06-25 NOTE — Addendum Note (Signed)
 Addended by: Damita Lack on: 06/25/2023 04:08 PM   Modules accepted: Orders

## 2023-06-26 NOTE — Telephone Encounter (Signed)
 Pt was seen at another office for this problem yesterday 06/25/23

## 2023-07-23 ENCOUNTER — Other Ambulatory Visit: Payer: Self-pay | Admitting: Obstetrics and Gynecology

## 2023-07-23 DIAGNOSIS — Z1231 Encounter for screening mammogram for malignant neoplasm of breast: Secondary | ICD-10-CM

## 2023-08-05 ENCOUNTER — Ambulatory Visit: Payer: No Typology Code available for payment source

## 2023-08-06 ENCOUNTER — Ambulatory Visit (INDEPENDENT_AMBULATORY_CARE_PROVIDER_SITE_OTHER): Payer: No Typology Code available for payment source | Admitting: Family Medicine

## 2023-08-06 ENCOUNTER — Encounter: Payer: Self-pay | Admitting: Family Medicine

## 2023-08-06 VITALS — BP 104/72 | HR 78 | Temp 98.1°F | Ht 67.0 in | Wt 189.0 lb

## 2023-08-06 DIAGNOSIS — N39498 Other specified urinary incontinence: Secondary | ICD-10-CM | POA: Diagnosis not present

## 2023-08-06 DIAGNOSIS — R3915 Urgency of urination: Secondary | ICD-10-CM | POA: Diagnosis not present

## 2023-08-06 DIAGNOSIS — R32 Unspecified urinary incontinence: Secondary | ICD-10-CM | POA: Insufficient documentation

## 2023-08-06 LAB — POC URINALSYSI DIPSTICK (AUTOMATED)
Bilirubin, UA: NEGATIVE
Blood, UA: NEGATIVE
Glucose, UA: NEGATIVE
Ketones, UA: NEGATIVE
Leukocytes, UA: NEGATIVE
Nitrite, UA: NEGATIVE
Protein, UA: POSITIVE — AB
Spec Grav, UA: 1.015 (ref 1.010–1.025)
Urobilinogen, UA: 0.2 U/dL
pH, UA: 7 (ref 5.0–8.0)

## 2023-08-06 NOTE — Assessment & Plan Note (Signed)
 Chronic, stress incontinence likely secondary to multiple children in the past and weak pelvic floor.  She has not had success with pelvic floor physical therapy per the patient.  Discussed Kegel exercises and surgical treatment if symptoms are severe.  She states it is tolerable at this time.

## 2023-08-06 NOTE — Assessment & Plan Note (Signed)
 Acute in the last 24 hours now resolved. Possible early UTI self treated with water and cranberry versus bladder irritation from urinary irritant. Recommended avoidance of bladder irritants and continue pushing water.  No further evaluation needed at this time. Return and ER precautions provided.

## 2023-08-06 NOTE — Progress Notes (Signed)
 Patient ID: Rachel Gallagher, female    DOB: January 27, 1981, 43 y.o.   MRN: [id number]  This visit was conducted in person.  BP 104/72 (BP Location: Left Arm, Patient Position: Sitting, Cuff Size: Large)   Pulse 78   Temp 98.1 F (36.7 C) (Temporal)   Ht 5\' 7"  (1.702 m)   Wt 189 lb (85.7 kg)   LMP 07/21/2023   SpO2 98%   BMI 29.60 kg/m    CC:  Chief Complaint  Patient presents with   Urinary Incontinence    Has been taking AZO Bladder Control    Subjective:   HPI: Rachel Gallagher is a 43 y.o. female  patient of Dr. Clent Ridges presenting on 08/06/2023 for Urinary Incontinence (Has been taking AZO Bladder Control)  She report new onset  urinary frequency  in last 24 hours.  No dysuria, no blood in urine.  No abd, no abd pain, no fever  pain.  Some low back pain  Tried AZO cranberry, lots of water. Today resolved frequency.   Has urinary incontinence with sneezing and trampoline for years.   Relevant past medical, surgical, family and social history reviewed and updated as indicated. Interim medical history since our last visit reviewed. Allergies and medications reviewed and updated. Outpatient Medications Prior to Visit  Medication Sig Dispense Refill   IBUPROFEN PO Take by mouth as needed.     ondansetron (ZOFRAN-ODT) 4 MG disintegrating tablet Take 1 tablet (4 mg total) by mouth every 8 (eight) hours as needed for nausea or vomiting. 20 tablet 0   oseltamivir (TAMIFLU) 75 MG capsule Take 1 capsule (75 mg total) by mouth 2 (two) times daily. 10 capsule 0   No facility-administered medications prior to visit.     Per HPI unless specifically indicated in ROS section below Review of Systems  Constitutional:  Negative for fatigue and fever.  HENT:  Negative for congestion.   Eyes:  Negative for pain.  Respiratory:  Negative for cough and shortness of breath.   Cardiovascular:  Negative for chest pain, palpitations and leg swelling.  Gastrointestinal:  Negative for abdominal  pain.  Genitourinary:  Negative for dysuria and vaginal bleeding.  Musculoskeletal:  Negative for back pain.  Neurological:  Negative for syncope, light-headedness and headaches.  Psychiatric/Behavioral:  Negative for dysphoric mood.    Objective:  BP 104/72 (BP Location: Left Arm, Patient Position: Sitting, Cuff Size: Large)   Pulse 78   Temp 98.1 F (36.7 C) (Temporal)   Ht 5\' 7"  (1.702 m)   Wt 189 lb (85.7 kg)   LMP 07/21/2023   SpO2 98%   BMI 29.60 kg/m   Wt Readings from Last 3 Encounters:  08/06/23 189 lb (85.7 kg)  06/25/23 186 lb 4 oz (84.5 kg)  08/08/22 185 lb 6.4 oz (84.1 kg)      Physical Exam Constitutional:      General: She is not in acute distress.    Appearance: Normal appearance. She is well-developed. She is not ill-appearing or toxic-appearing.  HENT:     Head: Normocephalic.     Right Ear: Hearing, tympanic membrane, ear canal and external ear normal. Tympanic membrane is not erythematous, retracted or bulging.     Left Ear: Hearing, tympanic membrane, ear canal and external ear normal. Tympanic membrane is not erythematous, retracted or bulging.     Nose: No mucosal edema or rhinorrhea.     Right Sinus: No maxillary sinus tenderness or frontal sinus tenderness.  Left Sinus: No maxillary sinus tenderness or frontal sinus tenderness.     Mouth/Throat:     Pharynx: Uvula midline.  Eyes:     General: Lids are normal. Lids are everted, no foreign bodies appreciated.     Conjunctiva/sclera: Conjunctivae normal.     Pupils: Pupils are equal, round, and reactive to light.  Neck:     Thyroid: No thyroid mass or thyromegaly.     Vascular: No carotid bruit.     Trachea: Trachea normal.  Cardiovascular:     Rate and Rhythm: Normal rate and regular rhythm.     Pulses: Normal pulses.     Heart sounds: Normal heart sounds, S1 normal and S2 normal. No murmur heard.    No friction rub. No gallop.  Pulmonary:     Effort: Pulmonary effort is normal. No tachypnea  or respiratory distress.     Breath sounds: Normal breath sounds. No decreased breath sounds, wheezing, rhonchi or rales.  Abdominal:     General: Bowel sounds are normal.     Palpations: Abdomen is soft.     Tenderness: There is no abdominal tenderness.  Musculoskeletal:     Cervical back: Normal range of motion and neck supple.  Skin:    General: Skin is warm and dry.     Findings: No rash.  Neurological:     Mental Status: She is alert.  Psychiatric:        Mood and Affect: Mood is not anxious or depressed.        Speech: Speech normal.        Behavior: Behavior normal. Behavior is cooperative.        Thought Content: Thought content normal.        Judgment: Judgment normal.       Results for orders placed or performed in visit on 08/06/23  POCT Urinalysis Dipstick (Automated)   Collection Time: 08/06/23  3:52 PM  Result Value Ref Range   Color, UA Yellow    Clarity, UA Clear    Glucose, UA Negative Negative   Bilirubin, UA Negative    Ketones, UA Negative    Spec Grav, UA 1.015 1.010 - 1.025   Blood, UA Negative    pH, UA 7.0 5.0 - 8.0   Protein, UA Positive (A) Negative   Urobilinogen, UA 0.2 0.2 or 1.0 E.U./dL   Nitrite, UA Negative    Leukocytes, UA Negative Negative    Assessment and Plan  Other urinary incontinence Assessment & Plan: Chronic, stress incontinence likely secondary to multiple children in the past and weak pelvic floor.  She has not had success with pelvic floor physical therapy per the patient.  Discussed Kegel exercises and surgical treatment if symptoms are severe.  She states it is tolerable at this time.  Orders: -     POCT Urinalysis Dipstick (Automated)  Urinary urgency Assessment & Plan: Acute in the last 24 hours now resolved. Possible early UTI self treated with water and cranberry versus bladder irritation from urinary irritant. Recommended avoidance of bladder irritants and continue pushing water.  No further evaluation needed at  this time. Return and ER precautions provided.     No follow-ups on file.   Kerby Nora, MD

## 2023-10-07 ENCOUNTER — Encounter: Admitting: Family Medicine

## 2023-10-16 ENCOUNTER — Other Ambulatory Visit: Payer: Self-pay | Admitting: Obstetrics and Gynecology

## 2023-10-16 DIAGNOSIS — Z1231 Encounter for screening mammogram for malignant neoplasm of breast: Secondary | ICD-10-CM

## 2023-10-17 ENCOUNTER — Ambulatory Visit
Admission: RE | Admit: 2023-10-17 | Discharge: 2023-10-17 | Disposition: A | Discharge status: Home / Self Care | Source: Ambulatory Visit | Attending: Obstetrics and Gynecology | Admitting: Obstetrics and Gynecology

## 2023-10-17 ENCOUNTER — Encounter

## 2023-10-17 DIAGNOSIS — Z1231 Encounter for screening mammogram for malignant neoplasm of breast: Secondary | ICD-10-CM

## 2023-12-16 ENCOUNTER — Encounter: Payer: Self-pay | Admitting: Family Medicine

## 2024-03-09 ENCOUNTER — Ambulatory Visit: Payer: Self-pay | Admitting: *Deleted

## 2024-03-09 NOTE — Telephone Encounter (Signed)
 Spoke with pt stated that the ear pain is gone today, denies any other symptoms. Offered pt an OV but declined, pt stated that she will call the office if any appointment needed

## 2024-03-09 NOTE — Telephone Encounter (Signed)
 FYI Only or Action Required?: Action required by provider: request for appointment and requesting appt today after 2:30 pm.  Patient was last seen in primary care on 08/06/2023 by Rachel Greig BRAVO, MD.  Called Nurse Triage reporting Otalgia.  Symptoms began yesterday.  Interventions attempted: OTC medications: ibuprofen  .  Symptoms are: gradually worsening.  Triage Disposition: See Physician Within 24 Hours  Patient/caregiver understands and will follow disposition?: No, wishes to speak with PCP                Copied from CRM #[id number]. Topic: Clinical - Red Word Triage >> Mar 09, 2024 10:31 AM [place] A wrote: [facility] that prompted transfer to Nurse Triage: Patient has pain in left ear-occurred yesterday Reason for Disposition  Earache  (Exceptions: Brief ear pain of lasting less than 60 minutes, or earache occurring during air travel.)  Answer Assessment - Initial Assessment Questions Offered appt tomorrow with other provider. None available with PCP . Patient declined and would like to be seen today after 2:30 pm if possible. Requesting call back.  Offered resource for UC/ mobile bus today .    1. LOCATION: Which ear is involved?     Left ear  2. ONSET: When did the ear pain start?      Yesterday  3. SEVERITY: How bad is the pain?  (Scale 1-10; mild, moderate or severe)     Mild to  moderate with ibuprofen   4. URI SYMPTOMS: Do you have a runny nose or cough?     No  5. FEVER: Do you have a fever? If Yes, ask: What is your temperature, how was it measured, and when did it start?     No  6. CAUSE: Have you been swimming recently?, How often do you use Q-TIPS?, Have you had any recent air travel or scuba diving?     No  7. OTHER SYMPTOMS: Do you have any other symptoms? (e.g., decreased hearing, dizziness, headache, stiff neck, vomiting)     Tried peroxide in left ear and nothing came out but peroxide, mild dizziness, recent lipo to chin and  swelling to chin continues. Can hear and no drainage no fever 8. PREGNANCY: Is there any chance you are pregnant? When was your last menstrual period?     na  Protocols used: [place]

## 2024-07-07 ENCOUNTER — Other Ambulatory Visit: Payer: Self-pay | Admitting: Medical Genetics

## 2024-07-14 ENCOUNTER — Encounter: Admitting: Family Medicine
